# Patient Record
Sex: Male | Born: 1965 | Race: Black or African American | Hispanic: No | Marital: Married | Smoking: Former smoker
Health system: Southern US, Community
[De-identification: ages and names within clinical notes are randomized; demographics above are authoritative.]

## PROBLEM LIST (undated history)

## (undated) DIAGNOSIS — M51369 Other intervertebral disc degeneration, lumbar region without mention of lumbar back pain or lower extremity pain: Secondary | ICD-10-CM

## (undated) DIAGNOSIS — M5136 Other intervertebral disc degeneration, lumbar region: Secondary | ICD-10-CM

## (undated) HISTORY — PX: NO PAST SURGERIES: SHX2092

---

## 2002-04-24 ENCOUNTER — Inpatient Hospital Stay (HOSPITAL_COMMUNITY): Admission: AC | Admit: 2002-04-24 | Discharge: 2002-04-25 | Payer: Self-pay

## 2002-04-24 ENCOUNTER — Encounter: Payer: Self-pay | Admitting: Orthopedic Surgery

## 2004-12-08 ENCOUNTER — Emergency Department: Payer: Self-pay | Admitting: Emergency Medicine

## 2004-12-12 ENCOUNTER — Emergency Department: Payer: Self-pay | Admitting: Emergency Medicine

## 2005-01-17 ENCOUNTER — Emergency Department: Payer: Self-pay | Admitting: Emergency Medicine

## 2005-12-26 ENCOUNTER — Emergency Department: Payer: Self-pay | Admitting: Emergency Medicine

## 2005-12-26 ENCOUNTER — Other Ambulatory Visit: Payer: Self-pay

## 2007-04-19 IMAGING — CR DG CHEST 2V
1 series · 2 of 2 positions shown · non-contrast
Comparison: none

REASON FOR EXAM: Chest pain
COMMENTS:

[Series 4418: postero_anterior · 0.22mm/px · 2 of 2 slices shown]
[im 1/2]
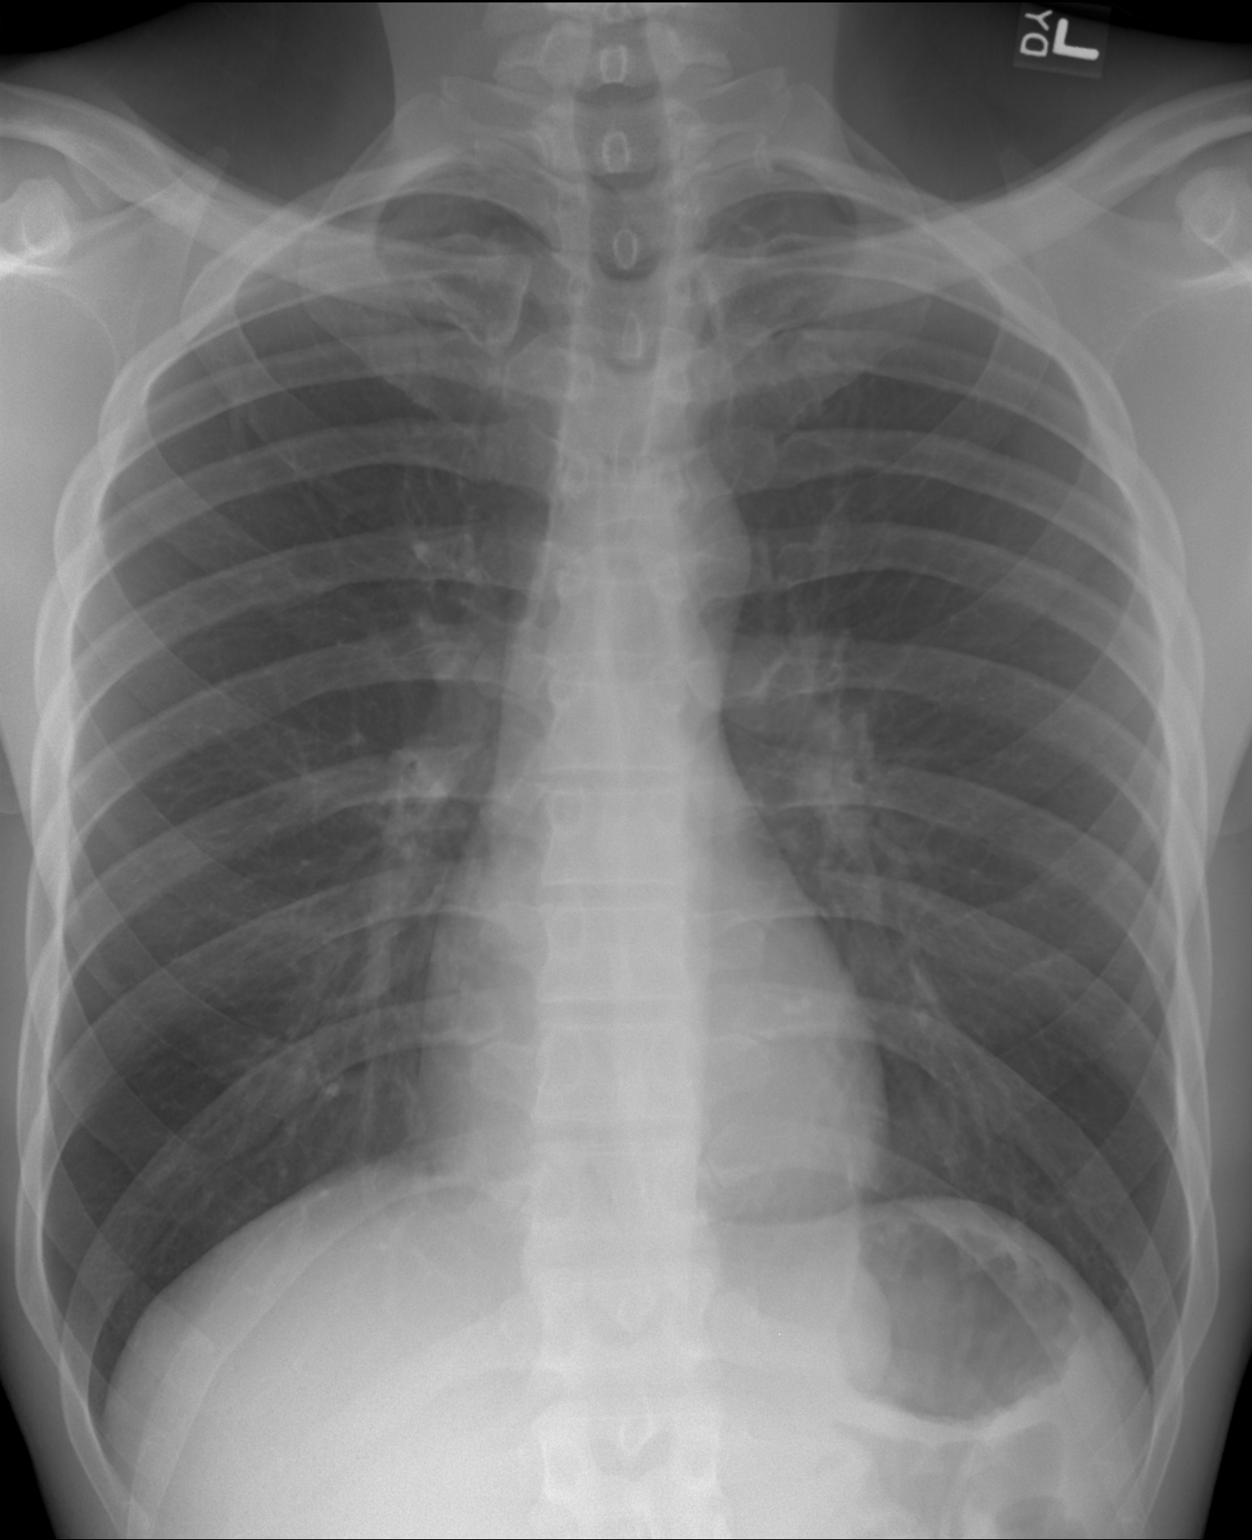
[im 2/2]
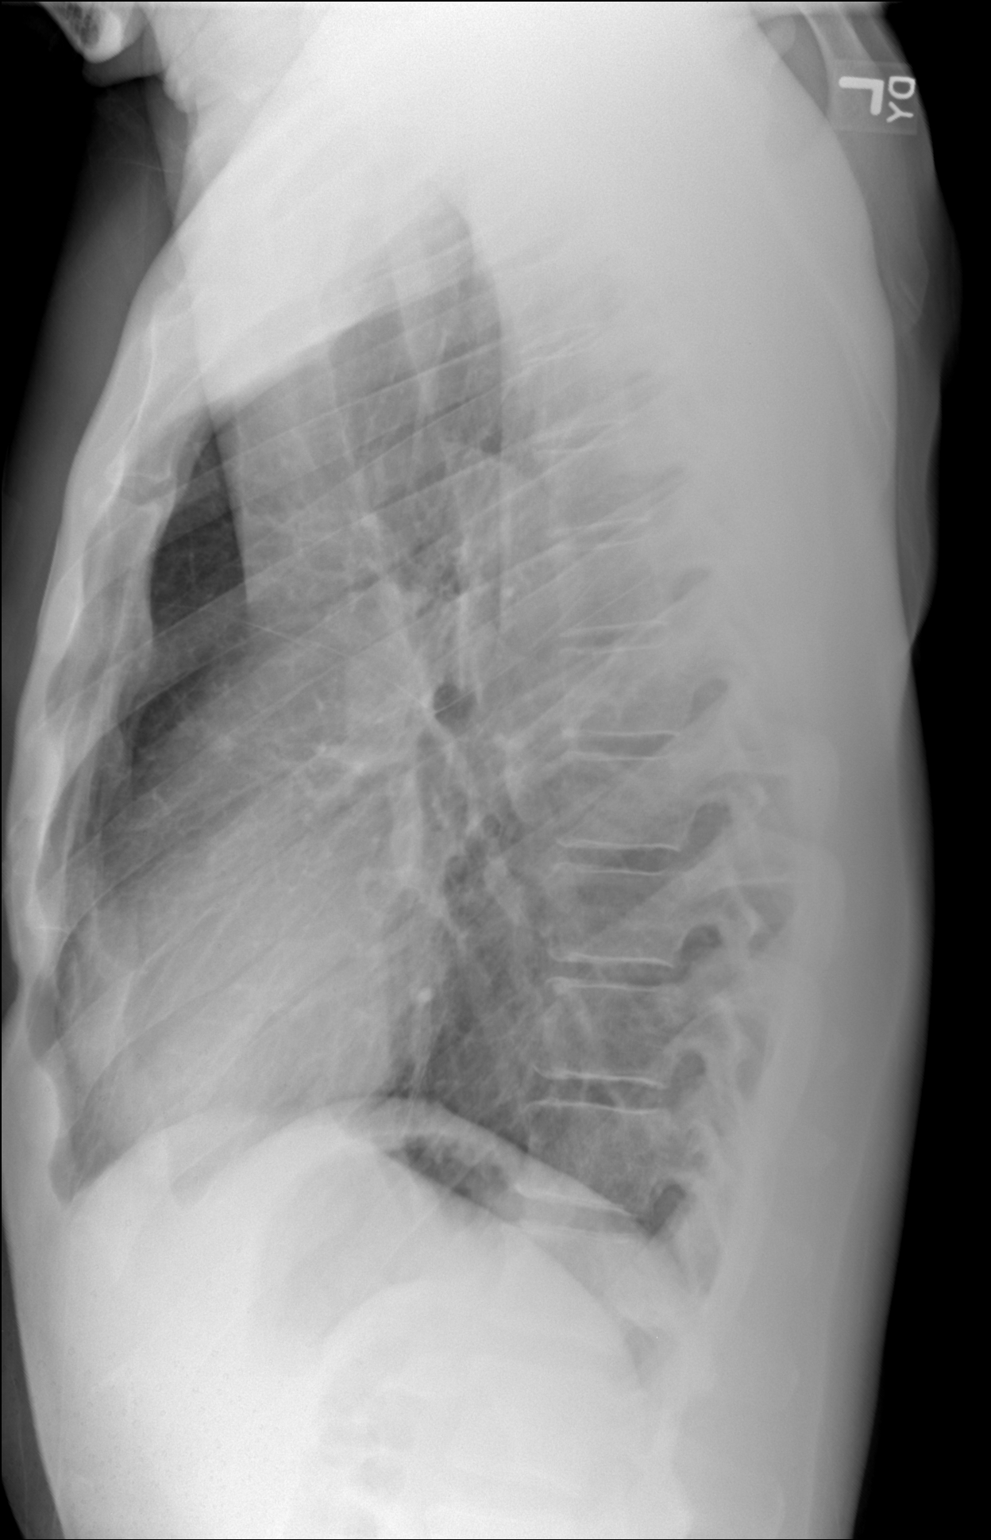

[2 of 2 positions shown; findings below may reference images not displayed]

PROCEDURE:     DXR - DXR CHEST PA (OR AP) AND LATERAL  - December 26, 2005 [DATE]

RESULT:     The lung fields are clear. No pneumonia, pneumothorax or pleural
effusion is seen. The heart size is normal. The mediastinal and osseous
structures show no significant abnormalities. The chest appears mildly
hyperexpanded suspicious for early manifestation of COPD or history of
asthma.
IMPRESSION: 1.     The lung fields are clear.
2.     The chest is mildly hyperexpanded.

## 2008-07-26 ENCOUNTER — Emergency Department: Payer: Self-pay | Admitting: Emergency Medicine

## 2009-02-09 ENCOUNTER — Ambulatory Visit: Payer: Self-pay | Admitting: Unknown Physician Specialty

## 2010-11-19 ENCOUNTER — Emergency Department: Payer: Self-pay | Admitting: Emergency Medicine
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
## 2011-05-19 ENCOUNTER — Emergency Department: Payer: Self-pay | Admitting: *Deleted

## 2012-03-12 IMAGING — CT CT NECK WITH CONTRAST
1 series · 12 of 14 positions shown, 15 images · IV contrast (isovue)
Comparison: none

REASON FOR EXAM: neck pain wtih numbness into the right arm atraumatic
with sore throat.
COMMENTS:

PROCEDURE:     CT  - CT NECK WITH CONTRAST  - November 19, 2010  [DATE]
RESULT:     Comparison: None.
TECHNIQUE: Standard neck CT protocol, after the administration of 75 mL
Isovue 370.

[Series 4: lung windows · axial · 0.66mm/px · z∈[+188,+263]mm · 12 of 31 slices shown, 15 images]
[im 3/31  soft-tissue]
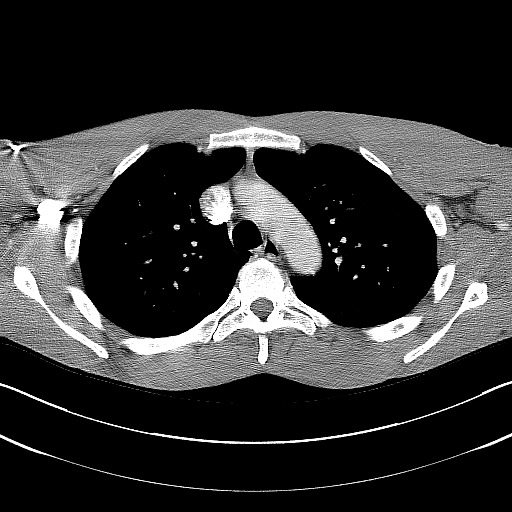
[im 3/31  bone]
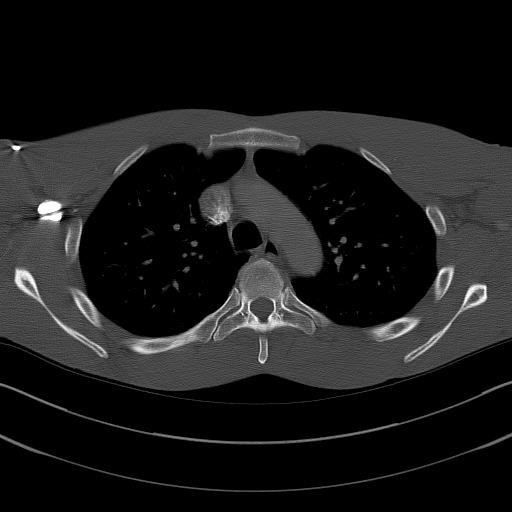
[im 5/31  bone]
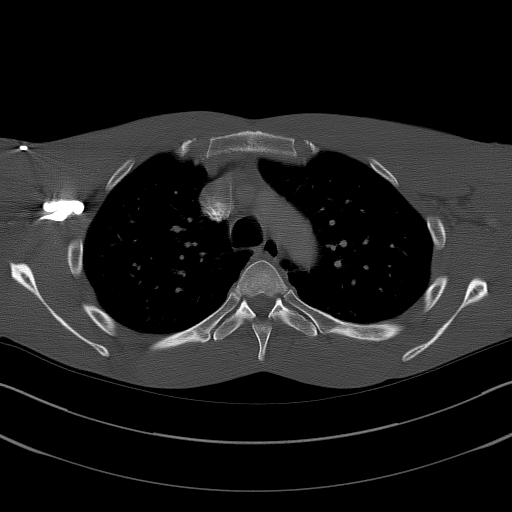
[im 7/31  bone]
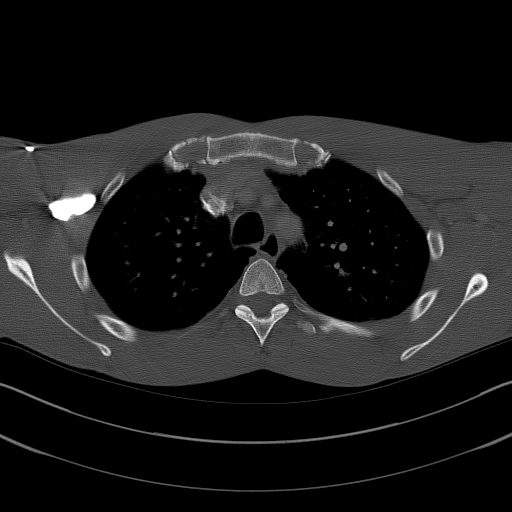
[im 10/31  bone]
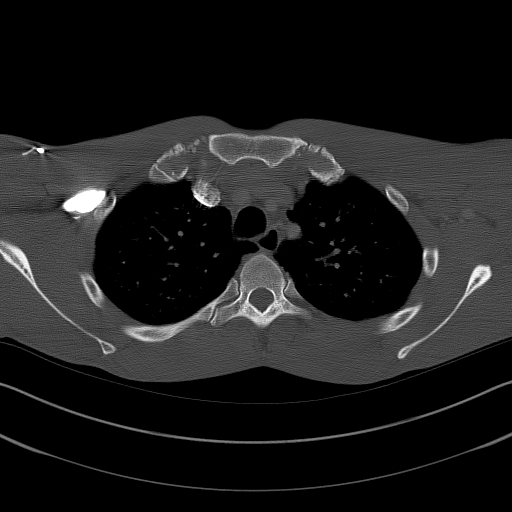
[im 12/31  soft-tissue]
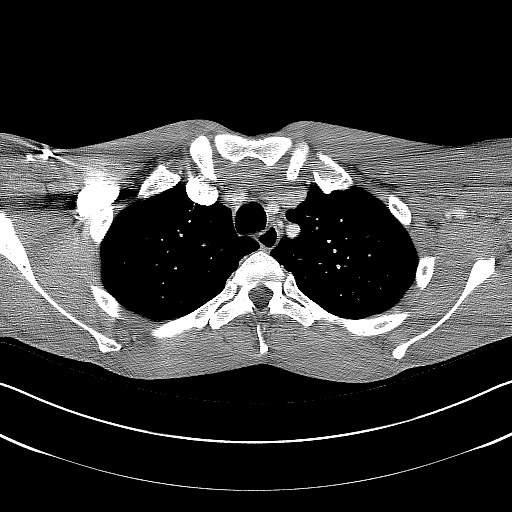
[im 12/31  bone]
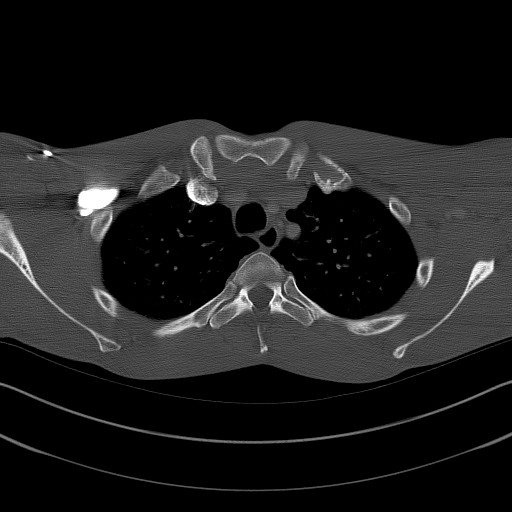
[im 14/31  bone]
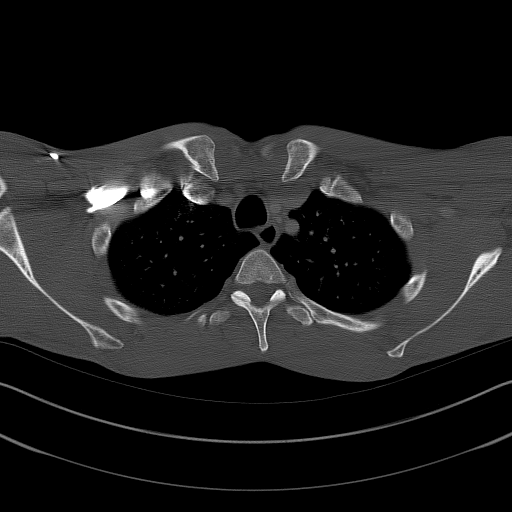
[im 17/31  bone]
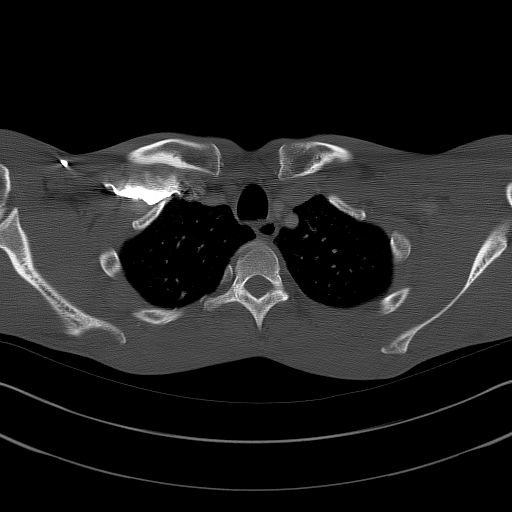
[im 19/31  bone]
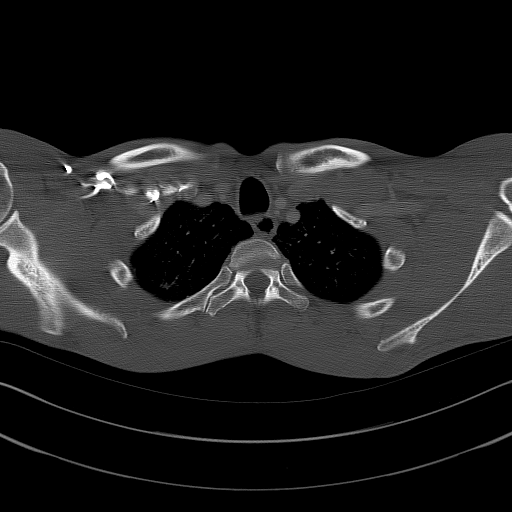
[im 21/31  soft-tissue]
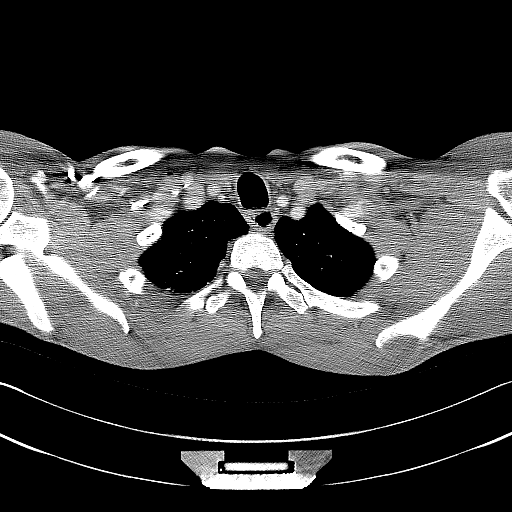
[im 21/31  bone]
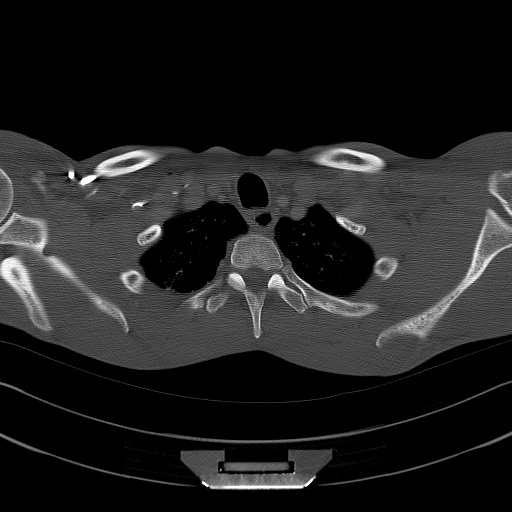
[im 24/31  bone]
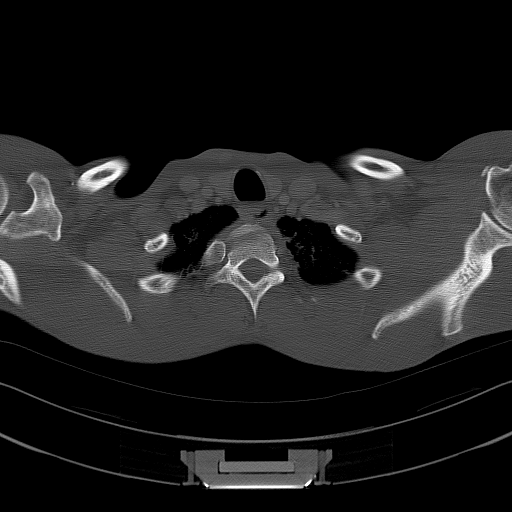
[im 26/31  bone]
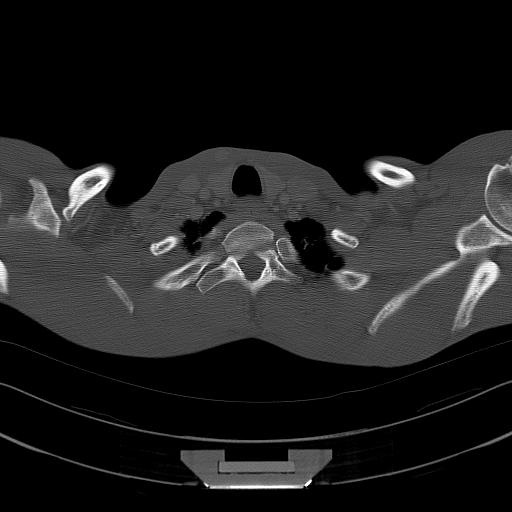
[im 28/31  bone]
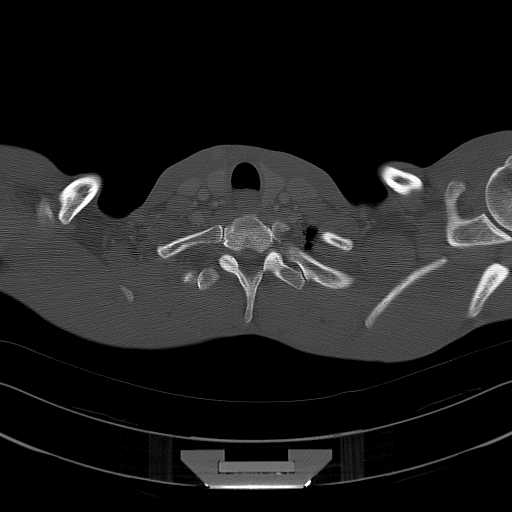

[12 of 14 positions shown; findings below may reference images not displayed]

FINDINGS: No lymphadenopathy identified in the neck. No fluid collection seen. The
prevertebral soft tissues are within normal limits. The airway is patent.
The palatine tonsils are slightly prominent, but otherwise unremarkable.

There is moderate opacification of the maxillary sinuses with polypoid
mucosal thickening.

No aggressive lytic or sclerotic osseous lesions identified. There is mild
paraseptal emphysema at the lung apices. There is mild scarring of the lung
apices.
IMPRESSION: 1. No acute findings in the neck.
2. Maxillary sinus disease.

## 2012-09-09 IMAGING — CR DG SHOULDER 3+V*L*
1 series · 3 of 3 positions shown · non-contrast
Comparison: none

REASON FOR EXAM: pain
COMMENTS:

[Series 1: view not recorded · 0.17mm/px · 3 of 3 slices shown]
[im 1/3]
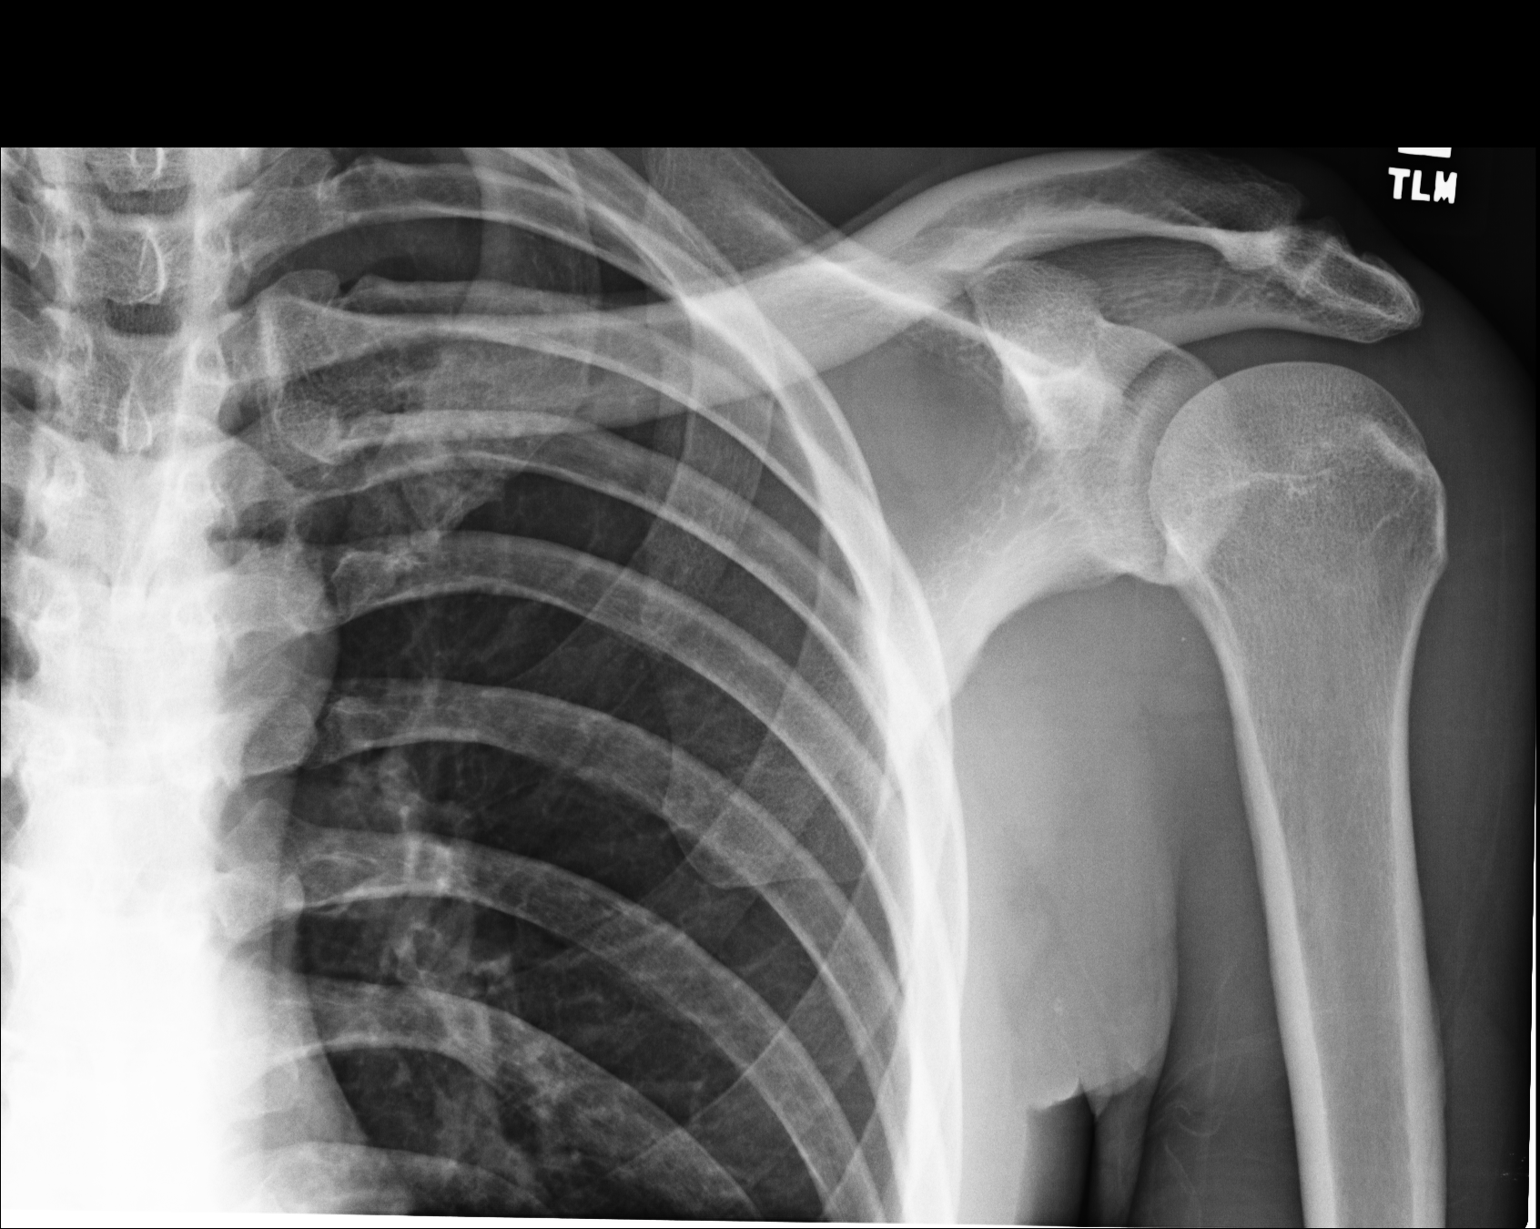
[im 2/3]
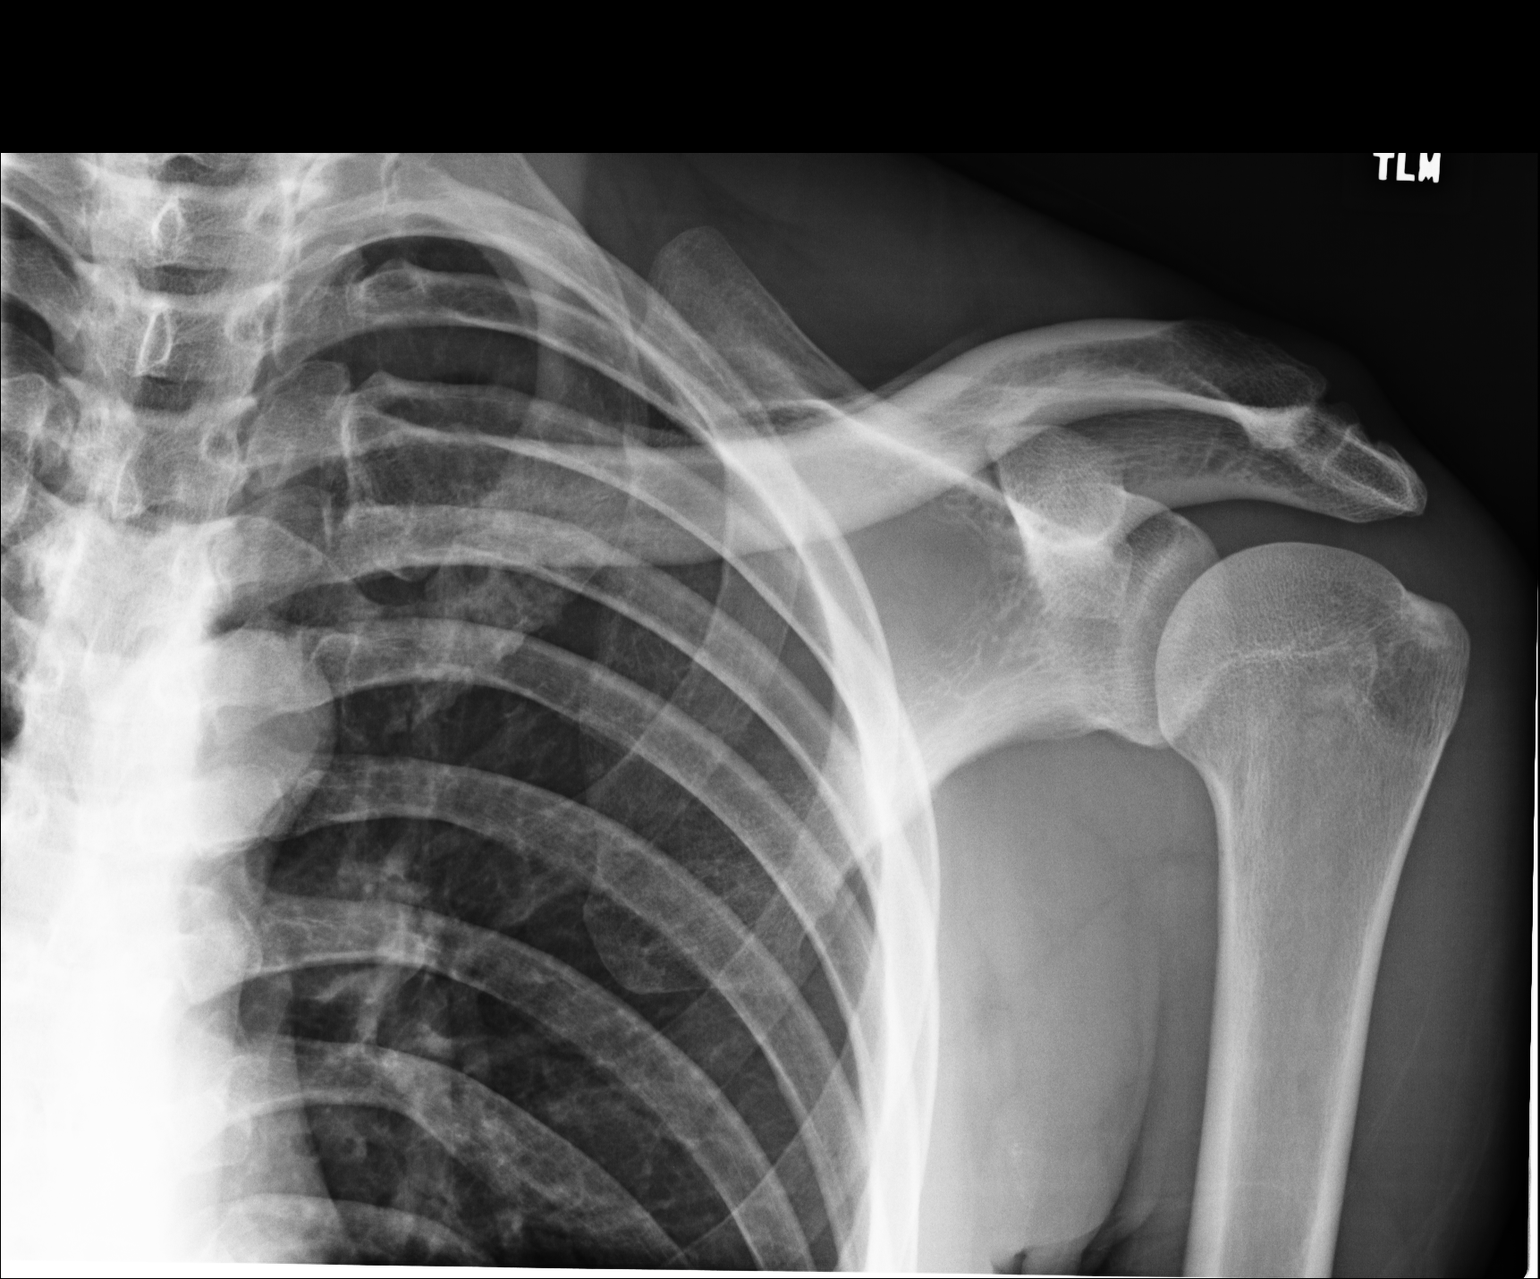
[im 3/3]
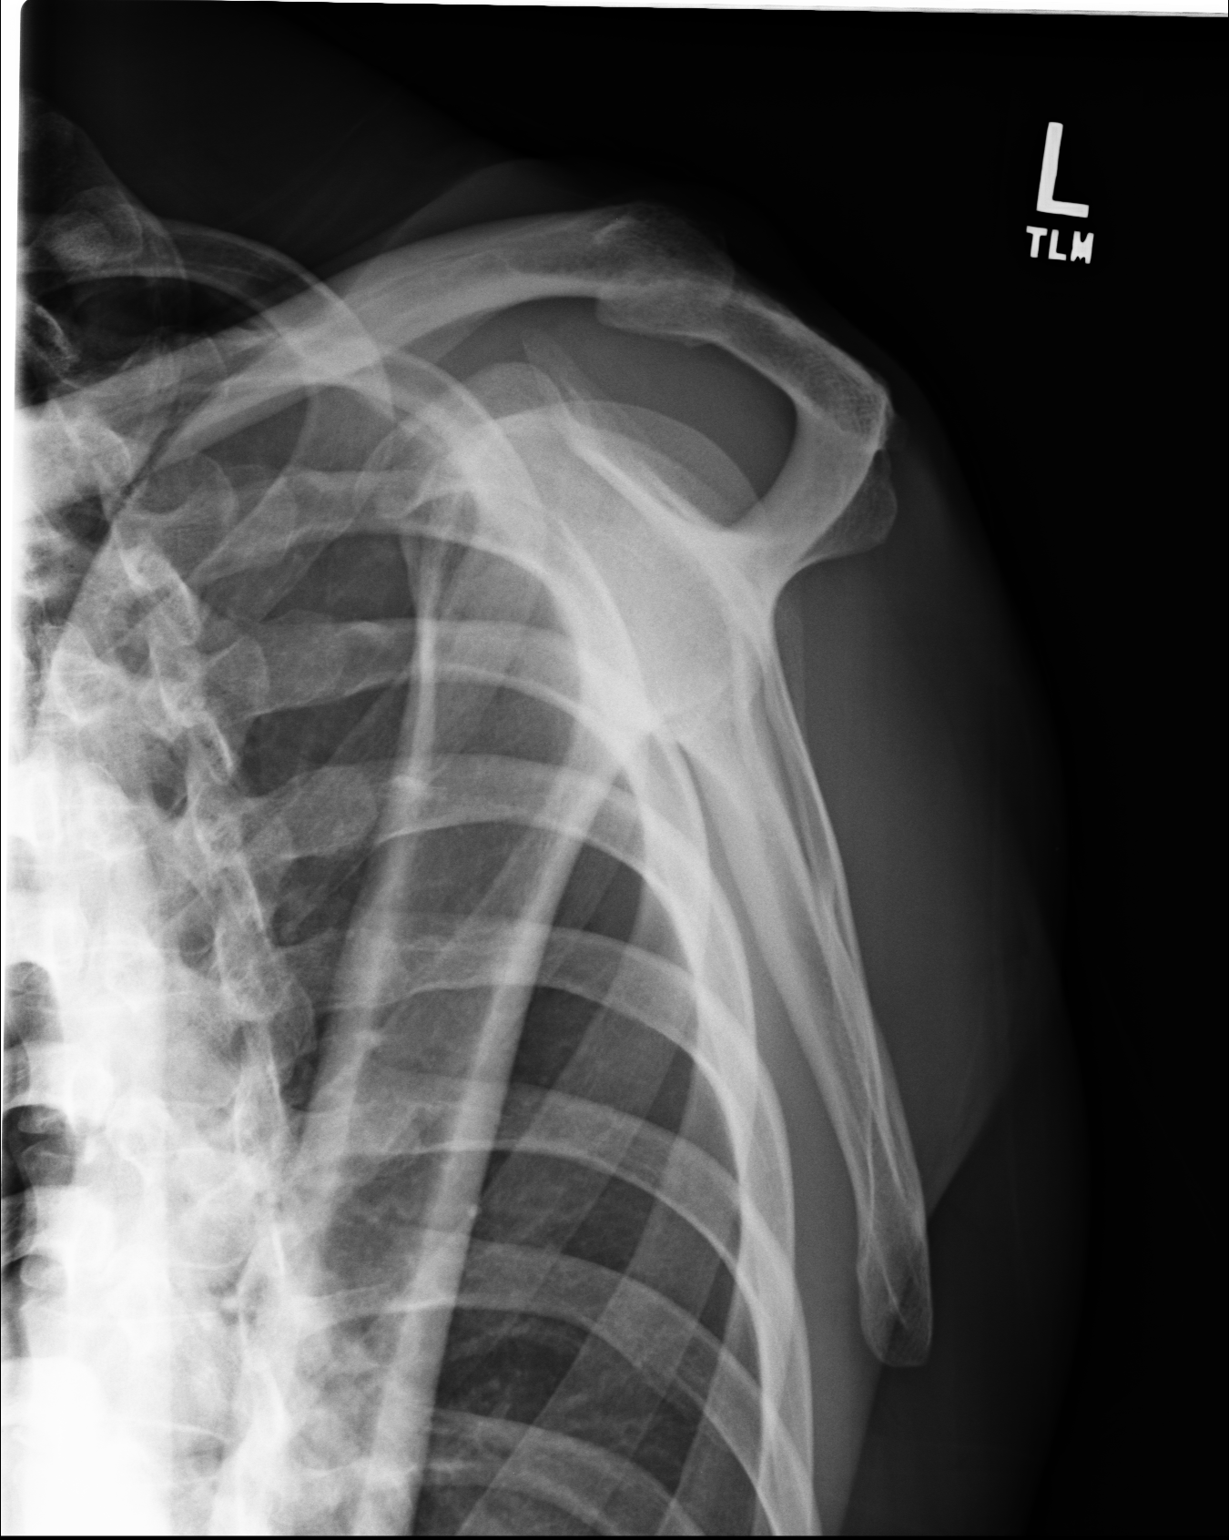

[3 of 3 positions shown; findings below may reference images not displayed]

PROCEDURE:     DXR - DXR SHOULDER LEFT COMPLETE  - May 19, 2011  [DATE]

RESULT:     Three views of the left shoulder are submitted. The bones appear
adequately mineralized. I do not see evidence of an acute fracture. No
significant degenerative changes are demonstrated. There is no evidence of
dislocation.
IMPRESSION: I see no acute bony abnormality of the left shoulder.

## 2018-01-20 ENCOUNTER — Emergency Department (HOSPITAL_COMMUNITY)
Admission: EM | Admit: 2018-01-20 | Discharge: 2018-01-21 | Disposition: A | Discharge status: Home / Self Care | Payer: Self-pay | Attending: Emergency Medicine | Admitting: Emergency Medicine

## 2018-01-20 DIAGNOSIS — M542 Cervicalgia: Secondary | ICD-10-CM | POA: Insufficient documentation

## 2018-01-20 DIAGNOSIS — J029 Acute pharyngitis, unspecified: Secondary | ICD-10-CM | POA: Insufficient documentation

## 2018-01-20 DIAGNOSIS — Z5321 Procedure and treatment not carried out due to patient leaving prior to being seen by health care provider: Secondary | ICD-10-CM | POA: Insufficient documentation

## 2018-01-21 ENCOUNTER — Other Ambulatory Visit: Payer: Self-pay

## 2018-01-21 ENCOUNTER — Encounter (HOSPITAL_COMMUNITY): Payer: Self-pay | Admitting: Emergency Medicine

## 2018-01-21 LAB — GROUP A STREP BY PCR: Group A Strep by PCR: NOT DETECTED

## 2018-01-21 NOTE — ED Triage Notes (Signed)
Pt reports sore throat and neck pain for the last 3 days. Pt reports pain when swallowing.

## 2018-01-21 NOTE — ED Notes (Signed)
Pt stated to this writer that he could not wait any longer and said he was going to leave

## 2018-06-25 ENCOUNTER — Other Ambulatory Visit: Payer: Self-pay

## 2018-06-25 ENCOUNTER — Ambulatory Visit
Admission: EM | Admit: 2018-06-25 | Discharge: 2018-06-25 | Disposition: A | Discharge status: Home / Self Care | Payer: 59 | Attending: Family Medicine | Admitting: Family Medicine

## 2018-06-25 ENCOUNTER — Encounter: Payer: Self-pay | Admitting: Emergency Medicine

## 2018-06-25 DIAGNOSIS — R69 Illness, unspecified: Secondary | ICD-10-CM | POA: Diagnosis not present

## 2018-06-25 DIAGNOSIS — M791 Myalgia, unspecified site: Secondary | ICD-10-CM

## 2018-06-25 DIAGNOSIS — R05 Cough: Secondary | ICD-10-CM | POA: Diagnosis not present

## 2018-06-25 DIAGNOSIS — R0981 Nasal congestion: Secondary | ICD-10-CM | POA: Diagnosis not present

## 2018-06-25 DIAGNOSIS — J111 Influenza due to unidentified influenza virus with other respiratory manifestations: Secondary | ICD-10-CM

## 2018-06-25 MED ORDER — IBUPROFEN 800 MG PO TABS: 800.0000 mg | ORAL_TABLET | Freq: Three times a day (TID) | ORAL | 0 refills | Status: DC | PRN

## 2018-06-25 MED ORDER — BENZONATATE 100 MG PO CAPS: 100.0000 mg | ORAL_CAPSULE | Freq: Three times a day (TID) | ORAL | 0 refills | Status: DC | PRN

## 2018-06-25 NOTE — Discharge Instructions (Signed)
Rest, fluids.  Use the medications as needed.  Take care  Dr. Adriana Simasook

## 2018-06-25 NOTE — ED Provider Notes (Signed)
[facility]    CSN: [id number] Arrival date & time: 06/25/18  1543  History   Chief Complaint Chief Complaint  Patient presents with  . Cough   HPI  52 year old male presents with cough, congestion, runny nose, body aches.  Patient reports that his symptoms started on Saturday.  He reports body aches, cough, congestion, and runny nose.  Symptoms are severe.  Most concerning symptom is body aches.  No documented fever.  No chills.  No medications or interventions tried.  No known exacerbating or relieving factors.  No other complaints.  Social history reviewed as below. Social History Social History   Tobacco Use  . Smoking status: Never Smoker  . Smokeless tobacco: Never Used  Substance Use Topics  . Alcohol use: Never    Frequency: Never  . Drug use: Never     Allergies   Patient has no known allergies.   Review of Systems Review of Systems  Constitutional: Negative for fever.  HENT: Positive for congestion and rhinorrhea.   Respiratory: Positive for cough.   Musculoskeletal:       Bodyaches.   Physical Exam Triage Vital Signs ED Triage Vitals  Enc Vitals Group     BP 06/25/18 1546 115/83     Pulse Rate 06/25/18 1546 62     Resp 06/25/18 1546 16     Temp 06/25/18 1546 98.2 F (36.8 C)     Temp Source 06/25/18 1546 Oral     SpO2 06/25/18 1546 99 %     Weight 06/25/18 1545 187 lb (84.8 kg)     Height 06/25/18 1545 6' 1.5" (1.867 m)     Head Circumference --      Peak Flow --      Pain Score 06/25/18 1545 7     Pain Loc --      Pain Edu? --      Excl. in GC? --    Updated Vital Signs BP 115/83 (BP Location: Left Arm)   Pulse 62   Temp 98.2 F (36.8 C) (Oral)   Resp 16   Ht 6' 1.5" (1.867 m)   Wt 84.8 kg   SpO2 99%   BMI 24.34 kg/m   Visual Acuity Right Eye Distance:   Left Eye Distance:   Bilateral Distance:    Right Eye Near:   Left Eye Near:    Bilateral Near:     Physical Exam  Constitutional: He is oriented to person,  place, and time. He appears well-developed. No distress.  HENT:  Head: Normocephalic and atraumatic.  Mouth/Throat: Oropharynx is clear and moist.  Cardiovascular: Normal rate and regular rhythm.  Pulmonary/Chest: Effort normal and breath sounds normal.  Neurological: He is alert and oriented to person, place, and time.  Psychiatric: He has a normal mood and affect. His behavior is normal.  Nursing note and vitals reviewed.  UC Treatments / Results  Labs (all labs ordered are listed, but only abnormal results are displayed) Labs Reviewed - No data to display  EKG None  Radiology No results found.  Procedures Procedures (including critical care time)  Medications Ordered in UC Medications - No data to display  Initial Impression / Assessment and Plan / UC Course  I have reviewed the triage vital signs and the nursing notes.  Pertinent labs & imaging results that were available during my care of the patient were reviewed by me and considered in my medical decision making (see chart for details).    52 year old  male presents with influenza-like illness.  Out of the treatment window for flu.  Ibuprofen and Tessalon Perles.  Supportive care.   Final Clinical Impressions(s) / UC Diagnoses   Final diagnoses:  Influenza-like illness     Discharge Instructions     Rest, fluids.  Use the medications as needed.  Take care  Dr. Adriana Simasook    ED Prescriptions    Medication Sig Dispense Auth. Provider   ibuprofen (ADVIL,MOTRIN) 800 MG tablet Take 1 tablet (800 mg total) by mouth every 8 (eight) hours as needed for fever, mild pain or moderate pain. 30 tablet Krislynn Gronau G, DO   benzonatate (TESSALON) 100 MG capsule Take 1 capsule (100 mg total) by mouth 3 (three) times daily as needed. 30 capsule Tommie Samsook, Camber Ninh G, DO     Controlled Substance Prescriptions [place] Controlled Substance Registry consulted? Not Applicable   Tommie SamsCook, Anapaula Severt G, DO 06/25/18 [id number]

## 2018-06-25 NOTE — ED Triage Notes (Signed)
Patient c/o bodyaches, cough, congestion and runny nose that started Saturday.

## 2018-08-12 ENCOUNTER — Other Ambulatory Visit: Payer: Self-pay

## 2018-08-12 ENCOUNTER — Ambulatory Visit
Admission: EM | Admit: 2018-08-12 | Discharge: 2018-08-12 | Disposition: A | Discharge status: Home / Self Care | Payer: 59 | Attending: Family Medicine | Admitting: Family Medicine

## 2018-08-12 ENCOUNTER — Encounter: Payer: Self-pay | Admitting: Emergency Medicine

## 2018-08-12 DIAGNOSIS — R300 Dysuria: Secondary | ICD-10-CM | POA: Diagnosis not present

## 2018-08-12 LAB — URINALYSIS, COMPLETE (UACMP) WITH MICROSCOPIC
BACTERIA UA: NONE SEEN
Bilirubin Urine: NEGATIVE
GLUCOSE, UA: NEGATIVE mg/dL
Hgb urine dipstick: NEGATIVE
Ketones, ur: NEGATIVE mg/dL
LEUKOCYTES UA: NEGATIVE
Nitrite: NEGATIVE
PROTEIN: NEGATIVE mg/dL
RBC / HPF: NONE SEEN RBC/hpf (ref 0–5)
SQUAMOUS EPITHELIAL / LPF: NONE SEEN (ref 0–5)
Specific Gravity, Urine: 1.025 (ref 1.005–1.030)
pH: 5.5 (ref 5.0–8.0)

## 2018-08-12 MED ORDER — AZITHROMYCIN 500 MG PO TABS
1000.0000 mg | ORAL_TABLET | Freq: Once | ORAL | Status: AC
  Administered 2018-08-12: 1000 mg via ORAL

## 2018-08-12 NOTE — ED Triage Notes (Signed)
Patient c/o dysuria and left side pain that started 3 days ago.

## 2018-08-12 NOTE — ED Provider Notes (Signed)
[facility]    CSN: [id number] Arrival date & time: 08/12/18  4098     History   Chief Complaint Chief Complaint  Patient presents with  . Dysuria  . Abdominal Pain    HPI Chad Jenkins is a 52 y.o. male.   52 yo male with a c/o burning with urination and left sided mild, intermittent low back pain for the past 3 days. Denies any fevers, chills, nausea, vomiting, hematuria, diarrhea, constipation, injury. States he has noticed some mild clear penile discharge. States he has had different sexual partners and "condom has broken". States would like to get checked for STD (gc/chlamydia) but declines HIV and RPR testing.   The history is provided by the patient.  Dysuria  Associated symptoms include abdominal pain.  Abdominal Pain  Associated symptoms: dysuria     History reviewed. No pertinent past medical history.  There are no active problems to display for this patient.   History reviewed. No pertinent surgical history.     Home Medications    Prior to Admission medications   Medication Sig Start Date End Date Taking? Authorizing Provider  benzonatate (TESSALON) 100 MG capsule Take 1 capsule (100 mg total) by mouth 3 (three) times daily as needed. 06/25/18   Tommie Sams, DO  ibuprofen (ADVIL,MOTRIN) 800 MG tablet Take 1 tablet (800 mg total) by mouth every 8 (eight) hours as needed for fever, mild pain or moderate pain. 06/25/18   Tommie Sams, DO    Family History History reviewed. No pertinent family history.  Social History Social History   Tobacco Use  . Smoking status: Current Every Day Smoker  . Smokeless tobacco: Never Used  Substance Use Topics  . Alcohol use: Never    Frequency: Never  . Drug use: Never     Allergies   Patient has no known allergies.   Review of Systems Review of Systems  Gastrointestinal: Positive for abdominal pain.  Genitourinary: Positive for dysuria.     Physical Exam Triage Vital Signs ED Triage  Vitals  Enc Vitals Group     BP 08/12/18 0821 129/80     Pulse Rate 08/12/18 0821 65     Resp 08/12/18 0821 18     Temp 08/12/18 0821 98.1 F (36.7 C)     Temp Source 08/12/18 0821 Oral     SpO2 08/12/18 0821 100 %     Weight 08/12/18 0822 180 lb (81.6 kg)     Height 08/12/18 0822 6\' 1"  (1.854 m)     Head Circumference --      Peak Flow --      Pain Score 08/12/18 0822 6     Pain Loc --      Pain Edu? --      Excl. in GC? --    No data found.  Updated Vital Signs BP 129/80 (BP Location: Left Arm)   Pulse 65   Temp 98.1 F (36.7 C) (Oral)   Resp 18   Ht 6\' 1"  (1.854 m)   Wt 81.6 kg   SpO2 100%   BMI 23.75 kg/m   Visual Acuity Right Eye Distance:   Left Eye Distance:   Bilateral Distance:    Right Eye Near:   Left Eye Near:    Bilateral Near:     Physical Exam  Constitutional: He is oriented to person, place, and time. He appears well-developed and well-nourished. No distress.  Cardiovascular: Normal rate.  Pulmonary/Chest: Effort normal. No  respiratory distress.  Abdominal: Soft. Bowel sounds are normal. He exhibits no distension and no mass. There is no tenderness. There is no rebound and no guarding.  Genitourinary: Testes normal and penis normal.  Lymphadenopathy: Inguinal adenopathy noted on the right (mild) and left (mild) side.  Neurological: He is alert and oriented to person, place, and time.  Skin: No rash noted. He is not diaphoretic.  Nursing note and vitals reviewed.    UC Treatments / Results  Labs (all labs ordered are listed, but only abnormal results are displayed) Labs Reviewed  CHLAMYDIA/NGC RT PCR ([facility] ONLY)  URINALYSIS, COMPLETE ([facility]) WITH MICROSCOPIC  MISC [facility] TEST (SEND OUT)    EKG None  Radiology No results found.  Procedures Procedures (including critical care time)  Medications Ordered in UC Medications  azithromycin (ZITHROMAX) tablet 1,000 mg (1,000 mg Oral Given 08/12/18 0900)    Initial Impression  / Assessment and Plan / UC Course  I have reviewed the triage vital signs and the nursing notes.  Pertinent labs & imaging results that were available during my care of the patient were reviewed by me and considered in my medical decision making (see chart for details).      Final Clinical Impressions(s) / UC Diagnoses   Final diagnoses:  Dysuria   Discharge Instructions   None    ED Prescriptions    None     1. diagnosis reviewed with patient 2. Patient given zithromax 1gm po x 1 (empiric tx) 3. Recommend checking labs as per orders 4. UA results negative and reviewed with patient  5. Follow-up prn if symptoms worsen or don't improve   Controlled Substance Prescriptions [place] Controlled Substance Registry consulted? Not Applicable   Payton Mccallum, MD 08/12/18 1015

## 2018-08-14 LAB — MISC LABCORP TEST (SEND OUT): LABCORP TEST CODE: 183194

## 2018-08-16 ENCOUNTER — Encounter: Payer: Self-pay | Admitting: Emergency Medicine

## 2018-08-16 ENCOUNTER — Other Ambulatory Visit: Payer: Self-pay

## 2018-08-16 ENCOUNTER — Ambulatory Visit
Admission: EM | Admit: 2018-08-16 | Discharge: 2018-08-16 | Disposition: A | Discharge status: Home / Self Care | Payer: 59 | Attending: Family Medicine | Admitting: Family Medicine

## 2018-08-16 DIAGNOSIS — Z87438 Personal history of other diseases of male genital organs: Secondary | ICD-10-CM

## 2018-08-16 LAB — CHLAMYDIA/NGC RT PCR (ARMC ONLY)
Chlamydia Tr: NOT DETECTED
N gonorrhoeae: NOT DETECTED

## 2018-08-16 MED ORDER — DOXYCYCLINE HYCLATE 100 MG PO TABS: 100.0000 mg | ORAL_TABLET | Freq: Two times a day (BID) | ORAL | 0 refills | Status: DC

## 2018-08-16 NOTE — ED Provider Notes (Addendum)
[facility]    CSN: [id number] Arrival date & time: 08/16/18  0911     History   Chief Complaint Chief Complaint  Patient presents with  . Penile Discharge    HPI Chad Jenkins is a 52 y.o. male.   52 yo male with a c/o penile discharge. Patient was seen 4 days ago with the same, treated empirically with zithromax and penile swab test negative to GC and chlamydia. Patient states symptoms are better, improved, however still having some penile drainage. Denies any fevers, chills.   The history is provided by the patient.  Penile Discharge     History reviewed. No pertinent past medical history.  There are no active problems to display for this patient.   Past Surgical History:  Procedure Laterality Date  . NO PAST SURGERIES         Home Medications    Prior to Admission medications   Medication Sig Start Date End Date Taking? Authorizing Provider  benzonatate (TESSALON) 100 MG capsule Take 1 capsule (100 mg total) by mouth 3 (three) times daily as needed. 06/25/18   Tommie Sams, DO  doxycycline (VIBRA-TABS) 100 MG tablet Take 1 tablet (100 mg total) by mouth 2 (two) times daily. 08/16/18   Payton Mccallum, MD  ibuprofen (ADVIL,MOTRIN) 800 MG tablet Take 1 tablet (800 mg total) by mouth every 8 (eight) hours as needed for fever, mild pain or moderate pain. 06/25/18   Tommie Sams, DO    Family History Family History  Problem Relation Age of Onset  . Healthy Mother   . Cancer Father        brain    Social History Social History   Tobacco Use  . Smoking status: Current Every Day Smoker    Types: Cigars  . Smokeless tobacco: Never Used  Substance Use Topics  . Alcohol use: Never    Frequency: Never  . Drug use: Never     Allergies   Patient has no known allergies.   Review of Systems Review of Systems  Genitourinary: Positive for discharge.     Physical Exam Triage Vital Signs ED Triage Vitals  Enc Vitals Group     BP 08/16/18  0934 (!) 127/110     Pulse Rate 08/16/18 0934 62     Resp 08/16/18 0934 17     Temp 08/16/18 0934 98.2 F (36.8 C)     Temp Source 08/16/18 0934 Oral     SpO2 08/16/18 0934 100 %     Weight 08/16/18 0931 186 lb (84.4 kg)     Height 08/16/18 0931 6\' 1"  (1.854 m)     Head Circumference --      Peak Flow --      Pain Score 08/16/18 0931 6     Pain Loc --      Pain Edu? --      Excl. in GC? --    No data found.  Updated Vital Signs BP (!) 127/110 (BP Location: Left Arm)   Pulse 62   Temp 98.2 F (36.8 C) (Oral)   Resp 17   Ht 6\' 1"  (1.854 m)   Wt 84.4 kg   SpO2 100%   BMI 24.54 kg/m   Visual Acuity Right Eye Distance:   Left Eye Distance:   Bilateral Distance:    Right Eye Near:   Left Eye Near:    Bilateral Near:     Physical Exam  Constitutional: He appears well-developed and  well-nourished. No distress.  Genitourinary: Testes normal and penis normal. No penile erythema or penile tenderness. No discharge found.  Genitourinary Comments: No penile discharge noted on exam; no skin lesions  Skin: He is not diaphoretic.  Nursing note and vitals reviewed.    UC Treatments / Results  Labs (all labs ordered are listed, but only abnormal results are displayed) Labs Reviewed  CHLAMYDIA/NGC RT PCR [facility] ONLY)    EKG None  Radiology No results found.  Procedures Procedures (including critical care time)  Medications Ordered in UC Medications - No data to display  Initial Impression / Assessment and Plan / UC Course  I have reviewed the triage vital signs and the nursing notes.  Pertinent labs & imaging results that were available during my care of the patient were reviewed by me and considered in my medical decision making (see chart for details).      Final Clinical Impressions(s) / UC Diagnoses   Final diagnoses:  History of penile discharge     Discharge Instructions     Follow up as needed    ED Prescriptions    Medication Sig  Dispense Auth. Provider   doxycycline (VIBRA-TABS) 100 MG tablet  (Status: Discontinued) Take 1 tablet (100 mg total) by mouth 2 (two) times daily. 20 tablet Payton Mccallum, MD   doxycycline (VIBRA-TABS) 100 MG tablet Take 1 tablet (100 mg total) by mouth 2 (two) times daily. 20 tablet Payton Mccallum, MD      1. Diagnosis and possible etiologies reviewed with patient 2. Will treat empirically with doxycycline; rx as per orders above; reviewed possible side effects, interactions, risks and benefits  3. Recheck test (GC/chlamydia)  4. Follow-up prn if symptoms worsen or don't improve   Controlled Substance Prescriptions [place] Controlled Substance Registry consulted? Not Applicable   Payton Mccallum, MD 08/16/18 3086    Payton Mccallum, MD 08/16/18 [phone]

## 2018-08-16 NOTE — ED Triage Notes (Signed)
Pt c/o penile discharge. He was seen on 08/12/18 and treated with 1000 mg of Azithromycin. He was tested for GC/chlamydia but the test was negative. A U/a was also done at that time and was negative.Pt is still having discharge, dysuria and left groin pain. He reports that it initially started about 7 days ago.

## 2018-08-16 NOTE — Discharge Instructions (Signed)
Follow up as needed

## 2019-09-15 ENCOUNTER — Other Ambulatory Visit: Payer: Self-pay | Admitting: Family Medicine

## 2019-09-15 DIAGNOSIS — Z20822 Contact with and (suspected) exposure to covid-19: Secondary | ICD-10-CM

## 2019-09-16 ENCOUNTER — Other Ambulatory Visit: Payer: Self-pay

## 2019-09-16 DIAGNOSIS — Z20822 Contact with and (suspected) exposure to covid-19: Secondary | ICD-10-CM

## 2019-09-18 LAB — NOVEL CORONAVIRUS, NAA: SARS-CoV-2, NAA: NOT DETECTED

## 2019-09-19 ENCOUNTER — Telehealth: Payer: Self-pay | Admitting: General Practice

## 2019-09-19 NOTE — Telephone Encounter (Signed)
   Pt call he saw his neg COVID results on mychart

## 2019-09-22 ENCOUNTER — Telehealth: Payer: Self-pay

## 2019-09-22 NOTE — Telephone Encounter (Signed)
Caller given negative result and verbalized understanding  

## 2019-09-24 ENCOUNTER — Telehealth: Payer: Self-pay

## 2019-09-24 NOTE — Telephone Encounter (Signed)
Pt notified of negative COVID-19 results. Understanding verbalized.  Chasta M Hopkins   

## 2021-04-13 DIAGNOSIS — H04123 Dry eye syndrome of bilateral lacrimal glands: Secondary | ICD-10-CM | POA: Diagnosis not present

## 2021-04-13 DIAGNOSIS — H52223 Regular astigmatism, bilateral: Secondary | ICD-10-CM | POA: Diagnosis not present

## 2021-04-13 DIAGNOSIS — H40012 Open angle with borderline findings, low risk, left eye: Secondary | ICD-10-CM | POA: Diagnosis not present

## 2021-04-13 DIAGNOSIS — H5203 Hypermetropia, bilateral: Secondary | ICD-10-CM | POA: Diagnosis not present

## 2021-04-29 ENCOUNTER — Other Ambulatory Visit: Payer: Self-pay

## 2021-04-29 ENCOUNTER — Encounter: Payer: Self-pay | Admitting: Emergency Medicine

## 2021-04-29 ENCOUNTER — Ambulatory Visit
Admission: EM | Admit: 2021-04-29 | Discharge: 2021-04-29 | Disposition: A | Discharge status: Home / Self Care | Payer: BC Managed Care – PPO | Attending: Sports Medicine | Admitting: Sports Medicine

## 2021-04-29 DIAGNOSIS — M545 Low back pain, unspecified: Secondary | ICD-10-CM | POA: Diagnosis not present

## 2021-04-29 DIAGNOSIS — M62830 Muscle spasm of back: Secondary | ICD-10-CM | POA: Diagnosis not present

## 2021-04-29 LAB — URINALYSIS, COMPLETE (UACMP) WITH MICROSCOPIC
Bilirubin Urine: NEGATIVE
Glucose, UA: NEGATIVE mg/dL
Hgb urine dipstick: NEGATIVE
Ketones, ur: NEGATIVE mg/dL
Leukocytes,Ua: NEGATIVE
Nitrite: NEGATIVE
Protein, ur: 30 mg/dL — AB
Specific Gravity, Urine: 1.02 (ref 1.005–1.030)
pH: 5.5 (ref 5.0–8.0)

## 2021-04-29 MED ORDER — NAPROXEN 500 MG PO TABS: 500.0000 mg | ORAL_TABLET | Freq: Two times a day (BID) | ORAL | 0 refills | Status: AC

## 2021-04-29 MED ORDER — CYCLOBENZAPRINE HCL 10 MG PO TABS: 10.0000 mg | ORAL_TABLET | Freq: Three times a day (TID) | ORAL | 0 refills | Status: AC | PRN

## 2021-04-29 NOTE — ED Triage Notes (Signed)
Pt c/o lower back pain. Started about 2 days ago. He states standing up and bending over makes the pain worse.

## 2021-04-29 NOTE — Discharge Instructions (Addendum)

## 2021-04-29 NOTE — ED Provider Notes (Signed)
[facility]    CSN: [id number] Arrival date & time: 04/29/21  9562      History   Chief Complaint Chief Complaint  Patient presents with   Back Pain    HPI Chad Jenkins is a 55 y.o. male presenting for 2-day history of aching left lower back pain.  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  Pain is mostly noticeable and aching whenever he bends forward, extends back, or rotates at hips.  Pain does not radiate to lower extremities.  No numbness, weakness or tingling.  No bowel or bladder incontinence or saddle anesthesia.  He has not taken any medication for his symptoms.  He states that he has had some urgency of urination but denies any dysuria, hematuria or abdominal pain.  He has no other complaints or concerns.  HPI  History reviewed. No pertinent past medical history.  There are no problems to display for this patient.   Past Surgical History:  Procedure Laterality Date   NO PAST SURGERIES         Home Medications    Prior to Admission medications   Medication Sig Start Date End Date Taking? Authorizing Provider  cyclobenzaprine (FLEXERIL) 10 MG tablet Take 1 tablet (10 mg total) by mouth 3 (three) times daily as needed for up to 7 days for muscle spasms. 04/29/21 05/06/21 Yes Eusebio Friendly B, PA-C  naproxen (NAPROSYN) 500 MG tablet Take 1 tablet (500 mg total) by mouth 2 (two) times daily for 10 days. 04/29/21 05/09/21 Yes Shirlee Latch, PA-C    Family History Family History  Problem Relation Age of Onset   Healthy Mother    Cancer Father        brain    Social History Social History   Tobacco Use   Smoking status: Every Day    Types: Cigars   Smokeless tobacco: Never  Vaping Use   Vaping Use: Never used  Substance Use Topics   Alcohol use: Never   Drug use: Never     Allergies   Patient has no known allergies.   Review of Systems Review of Systems  Constitutional:  Negative for fatigue and fever.   Gastrointestinal:  Negative for abdominal pain.  Genitourinary:  Positive for urgency. Negative for dysuria, flank pain and hematuria.  Musculoskeletal:  Positive for back pain. Negative for gait problem.  Skin:  Negative for rash.  Neurological:  Negative for weakness and numbness.    Physical Exam Triage Vital Signs ED Triage Vitals  Enc Vitals Group     BP 04/29/21 0932 104/66     Pulse Rate 04/29/21 0932 (!) 54     Resp 04/29/21 0932 18     Temp 04/29/21 0932 98.2 F (36.8 C)     Temp Source 04/29/21 0932 Oral     SpO2 04/29/21 0932 97 %     Weight 04/29/21 0931 186 lb 1.1 oz (84.4 kg)     Height 04/29/21 0931 6\' 1"  (1.854 m)     Head Circumference --      Peak Flow --      Pain Score 04/29/21 0929 6     Pain Loc --      Pain Edu? --      Excl. in GC? --    No data found.  Updated Vital Signs BP 104/66 (BP Location: Left Arm)   Pulse (!) 54   Temp 98.2 F (36.8 C) (Oral)   Resp  18   Ht 6\' 1"  (1.854 m)   Wt 186 lb 1.1 oz (84.4 kg)   SpO2 97%   BMI 24.55 kg/m       Physical Exam Vitals and nursing note reviewed.  Constitutional:      General: He is not in acute distress.    Appearance: Normal appearance. He is well-developed. He is not ill-appearing.  HENT:     Head: Normocephalic and atraumatic.  Eyes:     General: No scleral icterus.    Conjunctiva/sclera: Conjunctivae normal.  Cardiovascular:     Rate and Rhythm: Normal rate and regular rhythm.     Heart sounds: Normal heart sounds.  Pulmonary:     Effort: Pulmonary effort is normal. No respiratory distress.     Breath sounds: Normal breath sounds.  Abdominal:     Palpations: Abdomen is soft.     Tenderness: There is no abdominal tenderness. There is left CVA tenderness (mild). There is no right CVA tenderness.  Musculoskeletal:     Cervical back: Neck supple.     Thoracic back: Spasms (stiffness and spasms of thoracolumbar region on the left. Area is TTP) and tenderness present. No bony  tenderness. Normal range of motion (full ROM but movement pain with flexion, extension, rotation).     Lumbar back: Spasms and tenderness present. Normal range of motion. Negative right straight leg raise test and negative left straight leg raise test.       Back:  Skin:    General: Skin is warm and dry.  Neurological:     General: No focal deficit present.     Mental Status: He is alert. Mental status is at baseline.     Motor: No weakness.     Gait: Gait normal.  Psychiatric:        Mood and Affect: Mood normal.        Thought Content: Thought content normal.     UC Treatments / Results  Labs (all labs ordered are listed, but only abnormal results are displayed) Labs Reviewed  URINALYSIS, COMPLETE (UACMP) WITH MICROSCOPIC - Abnormal; Notable for the following components:      Result Value   Protein, ur 30 (*)    Bacteria, UA RARE (*)    All other components within normal limits    EKG   Radiology No results found.  Procedures Procedures (including critical care time)  Medications Ordered in UC Medications - No data to display  Initial Impression / Assessment and Plan / UC Course  I have reviewed the triage vital signs and the nursing notes.  Pertinent labs & imaging results that were available during my care of the patient were reviewed by me and considered in my medical decision making (see chart for details).  55 year old male presenting for left mid back pain for the past 2 days.  Denies injury.  Admits to urinary urgency which he thinks could possibly be associated with his symptoms.  On exam he has stiffness, tenderness and spasms of the left thoracolumbar region.  Full range of motion with pain with flexion, extension and rotation.  Negative straight leg raise.  Urinalysis performed today without any evidence of infection and no hematuria.  Reviewed this with patient.  No concern for pyelonephritis, UTI, or nephrolithiasis.  Symptoms consistent with muscle  strain and spasms.  Treating at this time with cyclobenzaprine and naproxen.  Also encouraged supportive care with use of heating pad and stretches.  ED precautions reviewed.  Work note given.  Final Clinical Impressions(s) / UC Diagnoses   Final diagnoses:  Acute left-sided low back pain without sciatica  Muscle spasm of back     Discharge Instructions      BACK PAIN: Stressed avoiding painful activities . RICE (REST, ICE, COMPRESSION, ELEVATION) guidelines reviewed. May alternate ice and heat. Consider use of muscle rubs, Salonpas patches, etc. Use medications as directed including muscle relaxers if prescribed. Take anti-inflammatory medications as prescribed or OTC NSAIDs/Tylenol.  F/u with PCP in 7-10 days for reexamination, and please feel free to call or return to the urgent care at any time for any questions or concerns you may have and we will be happy to help you!   BACK PAIN RED FLAGS: If the back pain acutely worsens or there are any red flag symptoms such as numbness/tingling, leg weakness, saddle anesthesia, or loss of bowel/bladder control, go immediately to the ER. Follow up with [place] as scheduled or sooner if the pain does not begin to resolve or if it worsens before the follow up       ED Prescriptions     Medication Sig Dispense Auth. Provider   naproxen (NAPROSYN) 500 MG tablet Take 1 tablet (500 mg total) by mouth 2 (two) times daily for 10 days. 20 tablet Eusebio Friendly B, PA-C   cyclobenzaprine (FLEXERIL) 10 MG tablet Take 1 tablet (10 mg total) by mouth 3 (three) times daily as needed for up to 7 days for muscle spasms. 20 tablet Gareth Morgan      PDMP not reviewed this encounter.   Shirlee Latch, PA-C 04/29/21 1015

## 2021-07-14 DIAGNOSIS — H40012 Open angle with borderline findings, low risk, left eye: Secondary | ICD-10-CM | POA: Diagnosis not present

## 2021-07-14 DIAGNOSIS — H5203 Hypermetropia, bilateral: Secondary | ICD-10-CM | POA: Diagnosis not present

## 2021-07-14 DIAGNOSIS — H04123 Dry eye syndrome of bilateral lacrimal glands: Secondary | ICD-10-CM | POA: Diagnosis not present

## 2021-07-14 DIAGNOSIS — H524 Presbyopia: Secondary | ICD-10-CM | POA: Diagnosis not present

## 2021-08-09 ENCOUNTER — Encounter: Payer: Self-pay | Admitting: Emergency Medicine

## 2021-08-09 ENCOUNTER — Ambulatory Visit (INDEPENDENT_AMBULATORY_CARE_PROVIDER_SITE_OTHER): Payer: BC Managed Care – PPO

## 2021-08-09 ENCOUNTER — Ambulatory Visit
Admission: EM | Admit: 2021-08-09 | Discharge: 2021-08-09 | Disposition: A | Discharge status: Home / Self Care | Payer: BC Managed Care – PPO | Attending: Emergency Medicine | Admitting: Emergency Medicine

## 2021-08-09 ENCOUNTER — Other Ambulatory Visit: Payer: Self-pay

## 2021-08-09 DIAGNOSIS — R519 Headache, unspecified: Secondary | ICD-10-CM

## 2021-08-09 DIAGNOSIS — M7989 Other specified soft tissue disorders: Secondary | ICD-10-CM | POA: Diagnosis not present

## 2021-08-09 DIAGNOSIS — M79671 Pain in right foot: Secondary | ICD-10-CM

## 2021-08-09 NOTE — ED Provider Notes (Signed)
[facility]    CSN: [id number] Arrival date & time: 08/09/21  1032      History   Chief Complaint Chief Complaint  Patient presents with   Foot Pain    right   Headache    HPI Chad Jenkins is a 55 y.o. male.   HPI  55 year old male here for evaluation of headache and right foot pain.  Patient reports that he has had a headache for the past 2 days and that it has resolved now.  The pain was in both of his temples and did not radiate.  There was some associated dizziness and blurry vision, both which have resolved.  Patient did not take any medications for the headache.  Patient denies chest pain, shortness of breath, nausea, or vomiting.  No history of hypertension.  Patient second complaint is pain and swelling over the lateral edge of his right foot that is been present for the past week and is not in the setting of any trauma.  He denies any numbness or tingling in his toes.  He does notice a knot on the outside of his foot that gets worse as the day goes on if he is standing on his feet for 10-hour shift.  History reviewed. No pertinent past medical history.  There are no problems to display for this patient.   Past Surgical History:  Procedure Laterality Date   NO PAST SURGERIES         Home Medications    Prior to Admission medications   Not on File    Family History Family History  Problem Relation Age of Onset   Healthy Mother    Cancer Father        brain    Social History Social History   Tobacco Use   Smoking status: Every Day    Types: Cigars   Smokeless tobacco: Never  Vaping Use   Vaping Use: Never used  Substance Use Topics   Alcohol use: Never   Drug use: Never     Allergies   Patient has no known allergies.   Review of Systems Review of Systems  Constitutional:  Negative for activity change, appetite change and fever.  Respiratory:  Negative for shortness of breath.   Cardiovascular:  Negative for chest pain.   Gastrointestinal:  Negative for nausea and vomiting.  Musculoskeletal:  Positive for arthralgias and joint swelling.  Skin:  Negative for color change and wound.  Neurological:  Positive for dizziness and headaches. Negative for syncope and weakness.  Hematological: Negative.   Psychiatric/Behavioral: Negative.      Physical Exam Triage Vital Signs ED Triage Vitals  Enc Vitals Group     BP 08/09/21 1235 105/71     Pulse Rate 08/09/21 1235 (!) 55     Resp 08/09/21 1235 18     Temp 08/09/21 1235 98.2 F (36.8 C)     Temp Source 08/09/21 1235 Oral     SpO2 08/09/21 1235 100 %     Weight 08/09/21 1233 186 lb 1.1 oz (84.4 kg)     Height 08/09/21 1233 6\' 1"  (1.854 m)     Head Circumference --      Peak Flow --      Pain Score 08/09/21 1232 6     Pain Loc --      Pain Edu? --      Excl. in GC? --    No data found.  Updated Vital Signs BP 105/71 (BP  Location: Right Arm)   Pulse (!) 55   Temp 98.2 F (36.8 C) (Oral)   Resp 18   Ht 6\' 1"  (1.854 m)   Wt 186 lb 1.1 oz (84.4 kg)   SpO2 100%   BMI 24.55 kg/m   Visual Acuity Right Eye Distance:   Left Eye Distance:   Bilateral Distance:    Right Eye Near:   Left Eye Near:    Bilateral Near:     Physical Exam Vitals and nursing note reviewed.  Constitutional:      General: He is not in acute distress.    Appearance: Normal appearance. He is not ill-appearing.  HENT:     Head: Normocephalic and atraumatic.     Nose: Nose normal. No congestion or rhinorrhea.     Mouth/Throat:     Mouth: Mucous membranes are moist.     Pharynx: Oropharynx is clear. No posterior oropharyngeal erythema.  Eyes:     General: No scleral icterus.    Extraocular Movements: Extraocular movements intact.     Pupils: Pupils are equal, round, and reactive to light.  Cardiovascular:     Rate and Rhythm: Normal rate and regular rhythm.     Pulses: Normal pulses.     Heart sounds: Normal heart sounds. No murmur heard.   No gallop.  Pulmonary:      Effort: Pulmonary effort is normal.     Breath sounds: Normal breath sounds. No wheezing, rhonchi or rales.  Musculoskeletal:        General: Swelling and tenderness present. No deformity or signs of injury. Normal range of motion.     Cervical back: Normal range of motion and neck supple.  Lymphadenopathy:     Cervical: No cervical adenopathy.  Skin:    General: Skin is warm and dry.     Capillary Refill: Capillary refill takes less than 2 seconds.     Findings: No bruising or erythema.  Neurological:     General: No focal deficit present.     Mental Status: He is alert and oriented to person, place, and time.     Cranial Nerves: No cranial nerve deficit.     Sensory: No sensory deficit.     Motor: No weakness.     Coordination: Coordination normal.     Gait: Gait normal.  Psychiatric:        Mood and Affect: Mood normal.        Thought Content: Thought content normal.        Judgment: Judgment normal.     UC Treatments / Results  Labs (all labs ordered are listed, but only abnormal results are displayed) Labs Reviewed - No data to display  EKG   Radiology DG Foot Complete Right  Result Date: 08/09/2021 CLINICAL DATA:  Pain and swelling over the fifth metatarsal base for the past week. No injury. EXAM: RIGHT FOOT COMPLETE - 3+ VIEW COMPARISON:  Right foot x-ray report dated December 12, 2004. FINDINGS: No acute fracture or dislocation. Mild first MTP joint osteoarthritis. Remaining joint spaces are preserved. Bone mineralization is normal. Soft tissues are unremarkable. Atherosclerotic calcification. IMPRESSION: 1.  No acute osseous abnormality. 2. Mild first MTP joint osteoarthritis. Electronically Signed   By: December 14, 2004 M.D.   On: 08/09/2021 13:09    Procedures Procedures (including critical care time)  Medications Ordered in UC Medications - No data to display  Initial Impression / Assessment and Plan / UC Course  I have reviewed the triage vital  signs and the  nursing notes.  Pertinent labs & imaging results that were available during my care of the patient were reviewed by me and considered in my medical decision making (see chart for details).  Patient is a nontoxic-appearing 55 year old male here for evaluation of a headache that been present for the past 2 days and is now resolved that was associated with dizziness and blurry vision yesterday.  Both those complaints and also now resolved.  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  He came back later in the day and the wait was too long so he went to [facility].  While at [facility] he had his vital signs checked which showed a blood pressure 150/136.  Patient has no history of hypertension.  Patient did not stay to be treated.  Patient's blood pressure is currently 105/71.  Patient's second complaint is pain and swelling to the lateral aspect of his right foot that has been present for the past week.  He does take bump on the lateral edge of his foot and there is a swollen area in line with the base of the fifth metatarsal.  There is no ecchymosis, edema, fluctuance, or induration present.  No erythema.  The area is mildly tender to palpation as well as the sole of the foot underlying the same area.  Patient has full range of motion and sensation of his foot and toes.  His ankle is well.  No tenderness of the phalanges, the remainder of his metatarsals or with palpation of the bones of the midfoot.  DP and PT pulses are 2+.  We will obtain radiograph of right foot to look for possible stress fracture versus degeneration.  Left foot x-rays independently reviewed and evaluated by me.  Impression: No evidence of fracture or dislocation.  Radiology overread is pending. Radiology impression is no acute osseous abnormality.  Mild first MTP  joint osteoarthritis.  Etiology of patient's right foot pain and swelling is unclear.  We will treat patient conservatively with OTC NSAIDs, ice, and elevation.  And give him contact information for podiatry for follow-up.   Final Clinical Impressions(s) / UC Diagnoses   Final diagnoses:  Foot pain, right  Acute nonintractable headache, unspecified headache type     Discharge Instructions      Your x-rays did not reveal the presence of any broken bones.  You do have some arthritis in your big toe but there is no evidence of arthritis where you are having pain and swelling.  Take over-the-counter ibuprofen according to the package instructions to help with pain and inflammation.  Ice your foot for 20 minutes at a time when you get off work to help with pain and inflammation.  Call [facility] to make an appointment to be evaluated by a podiatrist.  The numbers listed below.     ED Prescriptions   None    PDMP not reviewed this encounter.   Becky Augusta, NP 08/09/21 1329

## 2021-08-09 NOTE — ED Triage Notes (Signed)
Pt c/o headache, and left foot pain. Headache Started about 2 days ago. He states he went to the ED last night and was told his BP was elevated but did not stay to be treated. The foot pain started about a week ago.  He states the pain is located on the outer side of his foot. No known injury

## 2021-08-09 NOTE — Discharge Instructions (Addendum)
Your x-rays did not reveal the presence of any broken bones.  You do have some arthritis in your big toe but there is no evidence of arthritis where you are having pain and swelling.  Take over-the-counter ibuprofen according to the package instructions to help with pain and inflammation.  Ice your foot for 20 minutes at a time when you get off work to help with pain and inflammation.  Call [facility] to make an appointment to be evaluated by a podiatrist.  The numbers listed below.

## 2021-08-16 ENCOUNTER — Ambulatory Visit (INDEPENDENT_AMBULATORY_CARE_PROVIDER_SITE_OTHER): Payer: BC Managed Care – PPO | Admitting: Podiatry

## 2021-08-16 ENCOUNTER — Other Ambulatory Visit: Payer: Self-pay

## 2021-08-16 DIAGNOSIS — M7671 Peroneal tendinitis, right leg: Secondary | ICD-10-CM

## 2021-08-16 MED ORDER — MELOXICAM 15 MG PO TABS: 15.0000 mg | ORAL_TABLET | Freq: Every day | ORAL | 1 refills | Status: DC

## 2021-08-16 MED ORDER — BETAMETHASONE SOD PHOS & ACET 6 (3-3) MG/ML IJ SUSP
3.0000 mg | Freq: Once | INTRAMUSCULAR | Status: AC
  Administered 2021-08-16: 3 mg via INTRA_ARTICULAR

## 2021-08-16 NOTE — Progress Notes (Signed)
   HPI: 55 y.o. male presenting today as a new patient for evaluation of pain and tenderness to the right foot.  Pain has been ongoing for about 3 weeks now.  Gradual onset.  He believes that the pain started when he wore a new pair of shoes that he ordered off of [employer].  He went to the urgent care 08/09/2021 and was diagnosed with a bone spur and collapsing arches.  He was referred here.  He presents for further treatment and evaluation  No past medical history on file.   Physical Exam: General: The patient is alert and oriented x3 in no acute distress.  Dermatology: Skin is warm, dry and supple bilateral lower extremities. Negative for open lesions or macerations.  Vascular: Palpable pedal pulses bilaterally. No edema or erythema noted. Capillary refill within normal limits.  Neurological: Epicritic and protective threshold grossly intact bilaterally.   Musculoskeletal Exam: Range of motion within normal limits to all pedal and ankle joints bilateral. Muscle strength 5/5 in all groups bilateral.  Pain on palpation noted along the peroneal tendon as it inserts onto the fifth metatarsal tubercle.  Pain with direct palpation of the proximal portion of the fifth metatarsal tubercle  Radiographic Exam:  Normal osseous mineralization. Joint spaces preserved. No fracture/dislocation/boney destruction.    Assessment: 1.  Insertional peroneal tendinitis right   Plan of Care:  1. Patient evaluated. X-Rays reviewed.  2.  Injection of 0.5 cc Celestone Soluspan injected along the fifth metatarsal tubercle right 3.  Prescription for meloxicam 15 mg daily 4.  OTC power step insoles provided for the patient.  Wear daily 5.  Return to clinic in 4 weeks  *Works at [employer] (now [employer])      Felecia Shelling, DPM [facility]  Dr. Felecia Shelling, DPM    [address] [place], [place] [address]                Office [phone]   Fax [phone]

## 2021-09-16 ENCOUNTER — Other Ambulatory Visit: Payer: Self-pay

## 2021-09-16 ENCOUNTER — Ambulatory Visit (INDEPENDENT_AMBULATORY_CARE_PROVIDER_SITE_OTHER): Payer: BC Managed Care – PPO | Admitting: Podiatry

## 2021-09-16 DIAGNOSIS — M7671 Peroneal tendinitis, right leg: Secondary | ICD-10-CM | POA: Diagnosis not present

## 2021-09-19 MED ORDER — MELOXICAM 15 MG PO TABS: 15.0000 mg | ORAL_TABLET | Freq: Every day | ORAL | 1 refills | Status: DC

## 2021-09-19 NOTE — Progress Notes (Signed)
   HPI: 55 y.o. male presenting today for follow-up evaluation of insertional peroneal tendinitis to the right lower extremity.  Patient states that he is feeling much better.  The pain is significantly less than before.  He says the injection helped.  He continues to take the meloxicam daily.  He believes that the pain started when he wore a new pair of shoes that he ordered off of [employer].  He went to the urgent care 08/09/2021 and was diagnosed with a bone spur and collapsing arches.  He was referred here.    No past medical history on file.   Physical Exam: General: The patient is alert and oriented x3 in no acute distress.  Dermatology: Skin is warm, dry and supple bilateral lower extremities. Negative for open lesions or macerations.  Vascular: Palpable pedal pulses bilaterally. No edema or erythema noted. Capillary refill within normal limits.  Neurological: Epicritic and protective threshold grossly intact bilaterally.   Musculoskeletal Exam: Range of motion within normal limits to all pedal and ankle joints bilateral. Muscle strength 5/5 in all groups bilateral.  Pain on palpation noted along the peroneal tendon as it inserts onto the fifth metatarsal tubercle.  There is only some slight pain with direct palpation of the proximal portion of the fifth metatarsal tubercle  Assessment: 1.  Insertional peroneal tendinitis right   Plan of Care:  1. Patient evaluated 2.  Refill prescription for meloxicam 15 mg daily 3.  Continue OTC power step insoles 4.  Return to clinic as needed  *Works at [employer] (now [employer])      Felecia Shelling, DPM [facility]  Dr. Felecia Shelling, DPM    [address] [place], [place] [address]                Office [phone]  Fax [phone]

## 2021-10-07 ENCOUNTER — Encounter (HOSPITAL_COMMUNITY): Payer: Self-pay

## 2021-10-07 ENCOUNTER — Emergency Department (HOSPITAL_COMMUNITY)
Admission: EM | Admit: 2021-10-07 | Discharge: 2021-10-07 | Disposition: A | Discharge status: Home / Self Care | Payer: BC Managed Care – PPO | Attending: Emergency Medicine | Admitting: Emergency Medicine

## 2021-10-07 ENCOUNTER — Other Ambulatory Visit: Payer: Self-pay

## 2021-10-07 DIAGNOSIS — M79604 Pain in right leg: Secondary | ICD-10-CM | POA: Diagnosis not present

## 2021-10-07 DIAGNOSIS — M5431 Sciatica, right side: Secondary | ICD-10-CM | POA: Insufficient documentation

## 2021-10-07 DIAGNOSIS — M25551 Pain in right hip: Secondary | ICD-10-CM | POA: Insufficient documentation

## 2021-10-07 DIAGNOSIS — F17210 Nicotine dependence, cigarettes, uncomplicated: Secondary | ICD-10-CM | POA: Diagnosis not present

## 2021-10-07 DIAGNOSIS — M5432 Sciatica, left side: Secondary | ICD-10-CM | POA: Diagnosis not present

## 2021-10-07 MED ORDER — PREDNISONE 10 MG PO TABS: 20.0000 mg | ORAL_TABLET | Freq: Every day | ORAL | 0 refills | Status: AC

## 2021-10-07 MED ORDER — PREDNISONE 20 MG PO TABS
20.0000 mg | ORAL_TABLET | Freq: Once | ORAL | Status: AC
  Administered 2021-10-07: 12:00:00 20 mg via ORAL
  Filled 2021-10-07: qty 1

## 2021-10-07 NOTE — Discharge Instructions (Signed)
Return for any problem.  ?

## 2021-10-07 NOTE — ED Triage Notes (Signed)
Patient c/o right hip pain that radiates into the right leg to the right foot. Patient reports that he took a high step to get into a machine last week at work and has been hurting ever since.

## 2021-10-07 NOTE — ED Provider Notes (Signed)
[facility]-EMERGENCY DEPT Provider Note   CSN: [id number] Arrival date & time: 10/07/21  6144     History Chief Complaint  Patient presents with   Hip Pain   Leg Pain    Chad Jenkins is a 55 y.o. male.  55 year old male with prior medical history as detailed below presents for evaluation.  Patient with longstanding history of right-sided sciatica.  Patient complains of pain to the right hip.  Patient's described pain is consistent with his prior episodes of sciatica.  Patient reports increased pain with prolonged sitting.  Pain is improved with standing or with leg elevation.  Patient denies recent fever.  He denies weakness in his lower extremities.  Patient reports normal gait.  Patient reports intermittent use of Tylenol at home with minimal improvement in symptoms.  The history is provided by the patient.  Illness Location:  Right-sided hip and leg pain. Severity:  Mild Onset quality:  Gradual Duration:  1 week Progression:  Waxing and waning Chronicity:  Recurrent     History reviewed. No pertinent past medical history.  There are no problems to display for this patient.   Past Surgical History:  Procedure Laterality Date   NO PAST SURGERIES         Family History  Problem Relation Age of Onset   Healthy Mother    Cancer Father        brain    Social History   Tobacco Use   Smoking status: Every Day    Types: Cigars   Smokeless tobacco: Never  Vaping Use   Vaping Use: Never used  Substance Use Topics   Alcohol use: Never   Drug use: Never    Home Medications Prior to Admission medications   Medication Sig Start Date End Date Taking? Authorizing Provider  predniSONE (DELTASONE) 10 MG tablet Take 2 tablets (20 mg total) by mouth daily for 4 days. 10/07/21 10/11/21 Yes MessickNoralyn Pick, MD  meloxicam (MOBIC) 15 MG tablet Take 1 tablet (15 mg total) by mouth daily. 09/19/21   Felecia Shelling, DPM    Allergies    Patient has  no known allergies.  Review of Systems   Review of Systems  All other systems reviewed and are negative.  Physical Exam Updated Vital Signs BP (!) 155/86 (BP Location: Left Arm)    Pulse (!) 56    Temp 98.3 F (36.8 C) (Oral)    Resp 18    Ht 6' 1.5" (1.867 m)    Wt 77.1 kg    SpO2 100%    BMI 22.12 kg/m   Physical Exam Vitals and nursing note reviewed.  Constitutional:      General: He is not in acute distress.    Appearance: Normal appearance. He is well-developed.  HENT:     Head: Normocephalic and atraumatic.  Eyes:     Conjunctiva/sclera: Conjunctivae normal.     Pupils: Pupils are equal, round, and reactive to light.  Cardiovascular:     Rate and Rhythm: Normal rate and regular rhythm.     Heart sounds: Normal heart sounds.  Pulmonary:     Effort: Pulmonary effort is normal. No respiratory distress.     Breath sounds: Normal breath sounds.  Abdominal:     General: There is no distension.     Palpations: Abdomen is soft.     Tenderness: There is no abdominal tenderness.  Musculoskeletal:        General: No deformity. Normal range  of motion.     Cervical back: Normal range of motion and neck supple.  Skin:    General: Skin is warm and dry.  Neurological:     General: No focal deficit present.     Mental Status: He is alert and oriented to person, place, and time. Mental status is at baseline.     Cranial Nerves: No cranial nerve deficit.     Sensory: No sensory deficit.     Motor: No weakness.     Coordination: Coordination normal.    ED Results / Procedures / Treatments   Labs (all labs ordered are listed, but only abnormal results are displayed) Labs Reviewed - No data to display  EKG None  Radiology No results found.  Procedures Procedures   Medications Ordered in ED Medications  predniSONE (DELTASONE) tablet 20 mg (20 mg Oral Given 10/07/21 1142)    ED Course  I have reviewed the triage vital signs and the nursing notes.  Pertinent labs &  imaging results that were available during my care of the patient were reviewed by me and considered in my medical decision making (see chart for details).    MDM Rules/Calculators/A&P                         MDM  MSE complete  Chad Jenkins was evaluated in Emergency Department on 10/07/2021 for the symptoms described in the history of present illness. He was evaluated in the context of the global COVID-19 pandemic, which necessitated consideration that the patient might be at risk for infection with the SARS-CoV-2 virus that causes COVID-19. Institutional protocols and algorithms that pertain to the evaluation of patients at risk for COVID-19 are in a state of rapid change based on information released by regulatory bodies including the CDC and federal and state organizations. These policies and algorithms were followed during the patient's care in the ED.  Patient is presenting with complaint of right-sided sciatic pain.  Patient without lower extremity weakness, difficulty with bowel movements, difficulty with urination, or other red flag symptoms.  He is neurologically intact.  He is ambulatory without difficulty.  Patient would benefit from short course of steroids.  Patient understands need for close outpatient follow-up.  Strict return precautions given and understood.  Patient is advised that if he develops any particularly concerning symptoms he should return for ED evaluation and possible imaging.      Final Clinical Impression(s) / ED Diagnoses Final diagnoses:  Sciatica of right side    Rx / DC Orders ED Discharge Orders          Ordered    predniSONE (DELTASONE) 10 MG tablet  Daily        10/07/21 1202             Wynetta Fines, MD 10/07/21 1217

## 2021-10-07 NOTE — ED Provider Notes (Signed)
Emergency Medicine Provider Triage Evaluation Note  Chad Jenkins , a 55 y.o. male  was evaluated in triage.  Pt complains of left hip pain radiating into the left posterolateral leg. Described as sharp/shooting and causes his foot to feel numb when it occurs. He notes he took a high step to get into a machine at work last week with pain present ever since then.  Review of Systems  Positive: Left hip pain,  Negative: saddle paraesthesia, urinary/bowel dysfunction,    Physical Exam  BP (!) 155/86 (BP Location: Left Arm)    Pulse (!) 56    Temp 98.3 F (36.8 C) (Oral)    Resp 18    Ht 6' 1.5" (1.867 m)    Wt 77.1 kg    SpO2 100%    BMI 22.12 kg/m  Gen:   Awake, no distress   Resp:  Normal effort  MSK:   T-spine and L-spine nontender to palpation at midline. All joints supple and easily movable, no erythema, swelling or palpable deformity, all compartments soft. Hip ROM limited d/t pain Other:    Medical Decision Making  Medically screening exam initiated at 9:56 AM.  Appropriate orders placed.  DONTAI PEMBER was informed that the remainder of the evaluation will be completed by another provider, this initial triage assessment does not replace that evaluation, and the importance of remaining in the ED until their evaluation is complete.     Janell Quiet, [place] 10/07/21 1003    Pollyann Savoy, MD 10/07/21 [phone]

## 2021-10-22 ENCOUNTER — Ambulatory Visit (INDEPENDENT_AMBULATORY_CARE_PROVIDER_SITE_OTHER): Payer: BC Managed Care – PPO

## 2021-10-22 ENCOUNTER — Ambulatory Visit
Admission: EM | Admit: 2021-10-22 | Discharge: 2021-10-22 | Disposition: A | Discharge status: Home / Self Care | Payer: BC Managed Care – PPO | Attending: Emergency Medicine | Admitting: Emergency Medicine

## 2021-10-22 DIAGNOSIS — M25551 Pain in right hip: Secondary | ICD-10-CM

## 2021-10-22 DIAGNOSIS — M545 Low back pain, unspecified: Secondary | ICD-10-CM

## 2021-10-22 MED ORDER — HYDROCODONE-ACETAMINOPHEN 5-325 MG PO TABS: 1.0000 | ORAL_TABLET | Freq: Four times a day (QID) | ORAL | 0 refills | Status: DC | PRN
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
## 2021-10-22 NOTE — Discharge Instructions (Signed)
Your x-rays did not show any significant abnormality though you do have some bone growth on the backside of your right hip that is a result of your old trauma which may be a source of some of your pain.  Use over-the-counter Tylenol according to the package instructions as needed for mild to moderate pain.  You may also continue to take your meloxicam daily.  Take the Norco, 1 to 2 tablets every 6 hours as needed for severe pain.  This will sedate you so do not drink alcohol or drive if you take it.  Also be mindful that this product contains Tylenol and do not take more than 4000 mg of Tylenol a day.  Call [facility] Monday morning to schedule an appointment to be evaluated for possible treatment options.

## 2021-10-22 NOTE — ED Triage Notes (Signed)
Patient is here for "Back Pain". Previous injury "about 20 yrs ago" (hip dislocation). Now recently back pain "started back". Seen at [facility] for "Sciatic Pain". Not improving.

## 2021-11-02 DIAGNOSIS — M5431 Sciatica, right side: Secondary | ICD-10-CM | POA: Diagnosis not present

## 2021-11-09 DIAGNOSIS — M5431 Sciatica, right side: Secondary | ICD-10-CM | POA: Diagnosis not present

## 2021-11-14 DIAGNOSIS — M5431 Sciatica, right side: Secondary | ICD-10-CM | POA: Diagnosis not present

## 2022-02-13 ENCOUNTER — Other Ambulatory Visit: Payer: Self-pay

## 2022-02-13 ENCOUNTER — Ambulatory Visit
Admission: EM | Admit: 2022-02-13 | Discharge: 2022-02-13 | Disposition: A | Discharge status: Home / Self Care | Payer: BC Managed Care – PPO

## 2022-02-13 ENCOUNTER — Encounter: Payer: Self-pay | Admitting: Emergency Medicine

## 2022-02-13 DIAGNOSIS — M5416 Radiculopathy, lumbar region: Secondary | ICD-10-CM

## 2022-02-13 DIAGNOSIS — R634 Abnormal weight loss: Secondary | ICD-10-CM

## 2022-02-13 MED ORDER — MELOXICAM 7.5 MG PO TABS: 7.5000 mg | ORAL_TABLET | Freq: Every day | ORAL | 0 refills | Status: DC

## 2022-02-13 NOTE — ED Triage Notes (Signed)
Pt c/o right hip pain that radiates down his right leg. Started back in January. He states he has been seeing specialist for this and they say it is his back but he feels the pain in his hip. ?

## 2022-02-13 NOTE — ED Provider Notes (Signed)
?[facility] ? ? ? ?CSN: [id number] ?Arrival date & time: 02/13/22  1701 ? ? ?  ? ?History   ?Chief Complaint ?Chief Complaint  ?Patient presents with  ? Hip Pain  ? ? ?HPI ?Chad Jenkins is a 56 y.o. male who presents with  severe R hip pain radiating to his R foot, worse R leg pain since he received steroid injection yesterday. He has called them all day today and never heard back til 3 pm. His PCP call the spine surgeon, and he suggested to prescribe him Tramadol which he did not pick up yet. He states his R lateral hip, SI area and R leg hurts him. He denies back pain. Standing up after a few minutes causes him to have worse numbness and tingling of R leg and foot. He feels he is stepping on cold water on the arch of his foot. This gets better if he lays down on his L side.  ?Has been dealing with a lot of pain since January last this year and cant eat, and has lost a lot of wright. One year ago he knows he weighed about 177 lbs.  ?He has been pushing himself to work, but he could not move his foot of the car pedal to drive and his mother had to drive him here.  ? ? ?History reviewed. No pertinent past medical history. ? ?There are no problems to display for this patient. ? ? ?Past Surgical History:  ?Procedure Laterality Date  ? NO PAST SURGERIES    ? ? ? ? ? ?Home Medications   ? ?Prior to Admission medications   ?Medication Sig Start Date End Date Taking? Authorizing Provider  ?gabapentin (NEURONTIN) 300 MG capsule Take 300 mg by mouth 2 (two) times daily. 01/27/22  Yes [provider]  ?meloxicam (MOBIC) 7.5 MG tablet Take 1 tablet (7.5 mg total) by mouth daily. 02/13/22  Yes Rodriguez-Southworth, Nettie Elm, PA-C  ?tiZANidine (ZANAFLEX) 4 MG tablet Take 4 mg by mouth every 8 (eight) hours. 01/27/22  Yes [provider]  ?traMADol (ULTRAM) 50 MG tablet Take 50 mg by mouth every 6 (six) hours as needed. 01/09/22  Yes [provider]  ? ? ?Family History ?Family History  ?Problem  Relation Age of Onset  ? Healthy Mother   ? Cancer Father   ?     brain  ? ? ?Social History ?Social History  ? ?Tobacco Use  ? Smoking status: Every Day  ?  Types: Cigars  ? Smokeless tobacco: Never  ?Vaping Use  ? Vaping Use: Never used  ?Substance Use Topics  ? Alcohol use: Never  ? Drug use: Never  ? ? ? ?Allergies   ?Patient has no known allergies. ? ? ?Review of Systems ?Review of Systems  ?Constitutional:  Positive for unexpected weight change. Negative for fever.  ?Gastrointestinal:  Positive for constipation. Negative for abdominal pain.  ?     No bowel incontinence, has been very gassy. Has never had a colonoscopy  ?Genitourinary:  Negative for enuresis.  ?Neurological:  Positive for weakness and numbness.  ? ? ?Physical Exam ?Triage Vital Signs ?ED Triage Vitals  ?Enc Vitals Group  ?   BP 02/13/22 1726 117/80  ?   Pulse Rate 02/13/22 1726 60  ?   Resp 02/13/22 1726 18  ?   Temp 02/13/22 1726 98 ?F (36.7 ?C)  ?   Temp Source 02/13/22 1726 Oral  ?   SpO2 02/13/22 1726 100 %  ?  Weight 02/13/22 1723 169 lb 15.6 oz (77.1 kg)  ?   Height 02/13/22 1723 6\' 2"  (1.88 m)  ?   Head Circumference --   ?   Peak Flow --   ?   Pain Score 02/13/22 1722 8  ?   Pain Loc --   ?   Pain Edu? --   ?   Excl. in [place]? --   ? ?No data found. ?Wt 135.9  ? ?Updated Vital Signs ?BP 117/80 (BP Location: Left Arm)   Pulse 60   Temp 98 ?F (36.7 ?C) (Oral)   Resp 18   Ht 6\' 2"  (1.88 m)   Wt 135 lb 14.4 oz (61.6 kg)   SpO2 100%   BMI 17.45 kg/m?  ? ?Visual Acuity ?Right Eye Distance:   ?Left Eye Distance:   ?Bilateral Distance:   ? ?Right Eye Near:   ?Left Eye Near:    ?Bilateral Near:    ? ?Physical Exam ?Constitutional:   ?   General: He is in acute distress.  ?   Appearance: He is not toxic-appearing.  ?   Comments: Very thin who was in tears due to pain  ?HENT:  ?   Right Ear: External ear normal.  ?   Left Ear: External ear normal.  ?Eyes:  ?   General: No scleral icterus. ?   Conjunctiva/sclera: Conjunctivae normal.   ?Cardiovascular:  ?   Pulses: Normal pulses.  ?Pulmonary:  ?   Effort: Pulmonary effort is normal.  ?Abdominal:  ?   General: Abdomen is flat. Bowel sounds are normal.  ?   Palpations: Abdomen is soft. There is no mass.  ?   Tenderness: There is no abdominal tenderness. There is no guarding.  ?Musculoskeletal:  ?   Cervical back: Neck supple.  ?   Comments: BACK/ R HIP- has tenderness on R SI region, R inner iliac crest, and lateral trochanter with palpation. + SLR. ROM of hip provoked lateral hip pain and inner upper R thigh.  ?I could not test his R thigh strength due to his pain from just lifting his leg. Lower leg and foot strength 5/5.  He cant walk on his tip toe of R foot due to too much pain.   ?Skin: ?   General: Skin is warm and dry.  ?Neurological:  ?   Mental Status: He is alert and oriented to person, place, and time.  ?   Sensory: Sensory deficit present.  ?   Gait: Gait normal.  ?   Comments: R foot  ?Psychiatric:     ?   Mood and Affect: Mood normal.     ?   Behavior: Behavior normal.     ?   Thought Content: Thought content normal.     ?   Judgment: Judgment normal.  ? ? ? ?UC Treatments / Results  ?Labs ?(all labs ordered are listed, but only abnormal results are displayed) ?Labs Reviewed - No data to display ? ?EKG ? ? ?Radiology ?No results found. ? ?Procedures ?Procedures (including critical care time) ? ?Medications Ordered in UC ?Medications - No data to display ? ?Initial Impression / Assessment and Plan / UC Course  ?I have reviewed the triage vital signs and the nursing notes. ?R lumbar radiculopathy ?I advised pt to take the Tramadol q 6h, and if this does not give him enough relieve may double his Gabapentin to 600 mg tonight, then every am til he talks with his surgeon this week.  ? ? ? ? ?  Final Clinical Impressions(s) / UC Diagnoses  ? ?Final diagnoses:  ?Lumbar radiculopathy, right  ?Loss of weight  ? ? ? ?Discharge Instructions   ? ?  ?Increase the Gabapentin to 600 during the day.   ? ?Take the tramadol as prescribed and make sure you take  ? ? ? ? ?ED Prescriptions   ? ? Medication Sig Dispense Auth. Provider  ? meloxicam (MOBIC) 7.5 MG tablet Take 1 tablet (7.5 mg total) by mouth daily. 1 tablet Rodriguez-Southworth, Nettie ElmSylvia, PA-C  ? ?  ? ?PDMP not reviewed this encounter. ?  ?Garey HamRodriguez-Southworth, Kenzie Flakes, PA-C ?02/13/22 2039 ? ?

## 2022-02-13 NOTE — Discharge Instructions (Signed)
Increase the Gabapentin to 600 during the day.  ? ?Take the tramadol as prescribed and make sure you take  ?

## 2022-05-08 DIAGNOSIS — H524 Presbyopia: Secondary | ICD-10-CM | POA: Diagnosis not present

## 2022-05-08 DIAGNOSIS — H40012 Open angle with borderline findings, low risk, left eye: Secondary | ICD-10-CM | POA: Diagnosis not present

## 2022-05-08 DIAGNOSIS — H04123 Dry eye syndrome of bilateral lacrimal glands: Secondary | ICD-10-CM | POA: Diagnosis not present

## 2022-05-08 DIAGNOSIS — H5203 Hypermetropia, bilateral: Secondary | ICD-10-CM | POA: Diagnosis not present

## 2022-07-28 ENCOUNTER — Ambulatory Visit
Admission: EM | Admit: 2022-07-28 | Discharge: 2022-07-28 | Disposition: A | Discharge status: Home / Self Care | Payer: BC Managed Care – PPO | Attending: Emergency Medicine | Admitting: Emergency Medicine

## 2022-07-28 DIAGNOSIS — M5441 Lumbago with sciatica, right side: Secondary | ICD-10-CM | POA: Diagnosis not present

## 2022-07-28 DIAGNOSIS — Z202 Contact with and (suspected) exposure to infections with a predominantly sexual mode of transmission: Secondary | ICD-10-CM | POA: Insufficient documentation

## 2022-07-28 LAB — URINALYSIS, MICROSCOPIC (REFLEX)

## 2022-07-28 LAB — URINALYSIS, ROUTINE W REFLEX MICROSCOPIC
Glucose, UA: NEGATIVE mg/dL
Hgb urine dipstick: NEGATIVE
Leukocytes,Ua: NEGATIVE
Nitrite: NEGATIVE
Protein, ur: 100 mg/dL — AB
Specific Gravity, Urine: >1.03 — ABNORMAL HIGH (ref 1.005–1.030)
pH: 5.5 (ref 5.0–8.0)

## 2022-07-28 MED ORDER — DEXAMETHASONE SODIUM PHOSPHATE 10 MG/ML IJ SOLN
10.0000 mg | Freq: Once | INTRAMUSCULAR | Status: AC
  Administered 2022-07-28: 10 mg via INTRAMUSCULAR

## 2022-07-28 MED ORDER — PREDNISONE 10 MG (21) PO TBPK: ORAL_TABLET | ORAL | 0 refills | Status: DC

## 2022-07-28 MED ORDER — METRONIDAZOLE 500 MG PO TABS: 500.0000 mg | ORAL_TABLET | Freq: Two times a day (BID) | ORAL | 0 refills | Status: DC

## 2022-07-28 NOTE — ED Triage Notes (Signed)
Pt c/o lower back pain, pt reports he does have a bulging disc. Pt also reports some dysuria x couple days, urinary frequency   Pt also states wife tested positive for trichomonas x2 days ago. Pt requesting to be checked for STD.

## 2022-07-28 NOTE — Discharge Instructions (Addendum)
Take the prednisone starting tomorrow morning. Take it each morning at breakfast.  Take the Tizanidine you have been prescribed for muscle spasm.  Apply moist heat to your back for 30 minutes at a time 2-3 times a day to improve blood flow to the area and help remove the lactic acid causing the spasm.  Follow the back exercises given at discharge.  Take the Metronidazole twice daily for 7 days for treatment of your trichomonas exposure.  Avoid unprotected sexy until after you have finished your treatment.  If your testing comes back positive for other STI's you will be called and offered treatment options.  Return for reevaluation for any new or worsening symptoms.

## 2022-07-28 NOTE — ED Provider Notes (Signed)
[facility]    CSN: [id number] Arrival date & time: 07/28/22  1424      History   Chief Complaint Chief Complaint  Patient presents with   Back Pain   Dysuria   STD Check    HPI Chad Jenkins is a 57 y.o. male.   HPI  56 year old male here for evaluation of multiple complaints.  Patient reports that for the last 3 days he has been experiencing right-sided low back pain that radiates down the back and outside of his right leg to his toes.  He states that this is typical of his back pain flares as he has bulging disks.  Denies any injuries but states he has been doing heavy lifting at work.  In addition he is also been experiencing burning with urination for the past couple days along with urinary frequency.  A third issue is that his wife had a recent GYN exam and tested positive for trichomonas.  Patient denies any penile discharge.  History reviewed. No pertinent past medical history.  There are no problems to display for this patient.   Past Surgical History:  Procedure Laterality Date   NO PAST SURGERIES         Home Medications    Prior to Admission medications   Medication Sig Start Date End Date Taking? Authorizing Provider  gabapentin (NEURONTIN) 300 MG capsule Take 300 mg by mouth 2 (two) times daily. 01/27/22  Yes [provider]  metroNIDAZOLE (FLAGYL) 500 MG tablet Take 1 tablet (500 mg total) by mouth 2 (two) times daily. 07/28/22  Yes Becky Augusta, NP  predniSONE (STERAPRED UNI-PAK 21 TAB) 10 MG (21) TBPK tablet Take 6 tablets on day 1, 5 tablets day 2, 4 tablets day 3, 3 tablets day 4, 2 tablets day 5, 1 tablet day 6 07/28/22  Yes Becky Augusta, NP  tiZANidine (ZANAFLEX) 4 MG tablet Take 4 mg by mouth every 8 (eight) hours. 01/27/22  Yes [provider]  meloxicam (MOBIC) 7.5 MG tablet Take 1 tablet (7.5 mg total) by mouth daily. 02/13/22   Rodriguez-Southworth, Nettie Elm, PA-C  traMADol (ULTRAM) 50 MG tablet Take 50 mg by mouth  every 6 (six) hours as needed. 01/09/22   [provider]    Family History Family History  Problem Relation Age of Onset   Healthy Mother    Cancer Father        brain    Social History Social History   Tobacco Use   Smoking status: Every Day    Types: Cigars   Smokeless tobacco: Never  Vaping Use   Vaping Use: Never used  Substance Use Topics   Alcohol use: Never   Drug use: Never     Allergies   Patient has no known allergies.   Review of Systems Review of Systems  Genitourinary:  Positive for dysuria and frequency. Negative for hematuria, penile discharge and urgency.  Musculoskeletal:  Positive for back pain.     Physical Exam Triage Vital Signs ED Triage Vitals  Enc Vitals Group     BP 07/28/22 1438 123/87     Pulse Rate 07/28/22 1438 66     Resp --      Temp 07/28/22 1438 98.1 F (36.7 C)     Temp Source 07/28/22 1438 Oral     SpO2 07/28/22 1438 97 %     Weight 07/28/22 1436 143 lb 3.2 oz (65 kg)     Height 07/28/22 1436 6' 1.5" (1.867  m)     Head Circumference --      Peak Flow --      Pain Score 07/28/22 1435 7     Pain Loc --      Pain Edu? --      Excl. in [place]? --    No data found.  Updated Vital Signs BP 123/87 (BP Location: Left Arm)   Pulse 66   Temp 98.1 F (36.7 C) (Oral)   Ht 6' 1.5" (1.867 m)   Wt 143 lb 3.2 oz (65 kg)   SpO2 97%   BMI 18.64 kg/m   Visual Acuity Right Eye Distance:   Left Eye Distance:   Bilateral Distance:    Right Eye Near:   Left Eye Near:    Bilateral Near:     Physical Exam Vitals and nursing note reviewed.  Constitutional:      Appearance: Normal appearance.  HENT:     Head: Normocephalic and atraumatic.  Cardiovascular:     Rate and Rhythm: Normal rate and regular rhythm.     Pulses: Normal pulses.     Heart sounds: Normal heart sounds. No murmur heard.    No friction rub. No gallop.  Pulmonary:     Effort: Pulmonary effort is normal.     Breath sounds: Normal breath sounds. No  wheezing, rhonchi or rales.  Musculoskeletal:        General: Tenderness present. No swelling, deformity or signs of injury. Normal range of motion.  Skin:    General: Skin is warm and dry.     Capillary Refill: Capillary refill takes less than 2 seconds.  Neurological:     General: No focal deficit present.     Mental Status: He is alert and oriented to person, place, and time.  Psychiatric:        Mood and Affect: Mood normal.        Behavior: Behavior normal.        Thought Content: Thought content normal.        Judgment: Judgment normal.      UC Treatments / Results  Labs (all labs ordered are listed, but only abnormal results are displayed) Labs Reviewed  URINALYSIS, ROUTINE W REFLEX MICROSCOPIC - Abnormal; Notable for the following components:      Result Value   Specific Gravity, Urine >1.030 (*)    Bilirubin Urine SMALL (*)    Ketones, ur TRACE (*)    Protein, ur 100 (*)    All other components within normal limits  URINALYSIS, MICROSCOPIC (REFLEX) - Abnormal; Notable for the following components:   Bacteria, UA FEW (*)    All other components within normal limits  CYTOLOGY, (ORAL, ANAL, URETHRAL) ANCILLARY ONLY    EKG   Radiology No results found.  Procedures Procedures (including critical care time)  Medications Ordered in UC Medications  dexamethasone (DECADRON) injection 10 mg (10 mg Intramuscular Given 07/28/22 1610)    Initial Impression / Assessment and Plan / UC Course  I have reviewed the triage vital signs and the nursing notes.  Pertinent labs & imaging results that were available during my care of the patient were reviewed by me and considered in my medical decision making (see chart for details).   Patient is a nontoxic-appearing 56 year old male here for evaluation of multiple complaints as outlined HPI above.  Interpreter the patient's back pain patient has tenderness with palpation of the right lower lumbar paraspinous in the gluteal  region.  No significant spasm  present but he does have muscle tension.  His straight leg raise is negative bilaterally.  Bilateral lower extremity strength is 5/5.  I suspect that patient's back pain is musculoskeletal in nature.  He is already taking meloxicam and tizanidine without improvement of his symptoms.  I will have him continue the tizanidine and do a short course of prednisone at home in conjunction with a shot of Decadron here in clinic before he leaves.  A urinalysis was collected at triage and shows a high specific gravity of >1.030, small bilirubin, trace ketones, and 100 protein.  No glucose, hemoglobin, nitrites, or leukocyte esterase.  Reflex microscopy shows few bacteria but no WBCs or squamous epithelials.  Patient's dysuria may be coming from concentrated urine as his sample does show dehydration.  May also be secondary to his trichomonas exposure.  I will have him increase his oral fluid intake so that he increases his urine production and helps flush undiluted system.  I will order a penile swab look for the presence of gonorrhea, chlamydia, or trichomonas given his exposure.  I will also discharge him home on metronidazole twice daily for 7 days for treatment of presumptive trichomonas while his STI swab is pending.   Final Clinical Impressions(s) / UC Diagnoses   Final diagnoses:  Acute right-sided low back pain with right-sided sciatica  Trichomonas exposure     Discharge Instructions      Take the prednisone starting tomorrow morning. Take it each morning at breakfast.  Take the Tizanidine you have been prescribed for muscle spasm.  Apply moist heat to your back for 30 minutes at a time 2-3 times a day to improve blood flow to the area and help remove the lactic acid causing the spasm.  Follow the back exercises given at discharge.  Take the Metronidazole twice daily for 7 days for treatment of your trichomonas exposure.  Avoid unprotected sexy until after you  have finished your treatment.  If your testing comes back positive for other STI's you will be called and offered treatment options.  Return for reevaluation for any new or worsening symptoms.      ED Prescriptions     Medication Sig Dispense Auth. Provider   predniSONE (STERAPRED UNI-PAK 21 TAB) 10 MG (21) TBPK tablet Take 6 tablets on day 1, 5 tablets day 2, 4 tablets day 3, 3 tablets day 4, 2 tablets day 5, 1 tablet day 6 21 tablet Becky Augusta, NP   metroNIDAZOLE (FLAGYL) 500 MG tablet Take 1 tablet (500 mg total) by mouth 2 (two) times daily. 14 tablet Becky Augusta, NP      PDMP not reviewed this encounter.   Becky Augusta, NP 07/28/22 1614

## 2022-07-31 LAB — CYTOLOGY, (ORAL, ANAL, URETHRAL) ANCILLARY ONLY
Chlamydia: NEGATIVE
Comment: NEGATIVE
Comment: NEGATIVE
Comment: NORMAL
Neisseria Gonorrhea: NEGATIVE
Trichomonas: NEGATIVE

## 2022-09-05 ENCOUNTER — Other Ambulatory Visit: Payer: Self-pay

## 2022-09-05 ENCOUNTER — Encounter: Payer: Self-pay | Admitting: Emergency Medicine

## 2022-09-05 ENCOUNTER — Ambulatory Visit
Admission: EM | Admit: 2022-09-05 | Discharge: 2022-09-05 | Disposition: A | Discharge status: Home / Self Care | Payer: BC Managed Care – PPO | Attending: Physician Assistant | Admitting: Physician Assistant

## 2022-09-05 DIAGNOSIS — G44209 Tension-type headache, unspecified, not intractable: Secondary | ICD-10-CM

## 2022-09-05 MED ORDER — HYDROXYZINE PAMOATE 25 MG PO CAPS: 25.0000 mg | ORAL_CAPSULE | Freq: Every day | ORAL | 0 refills | Status: AC

## 2022-09-05 NOTE — Discharge Instructions (Addendum)
Return if any problems.  See your Physicain for recheck  
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
## 2022-12-05 ENCOUNTER — Other Ambulatory Visit: Payer: Self-pay

## 2022-12-05 ENCOUNTER — Ambulatory Visit
Admission: EM | Admit: 2022-12-05 | Discharge: 2022-12-05 | Disposition: A | Discharge status: Home / Self Care | Payer: BC Managed Care – PPO | Attending: Physician Assistant | Admitting: Physician Assistant

## 2022-12-05 DIAGNOSIS — M5441 Lumbago with sciatica, right side: Secondary | ICD-10-CM

## 2022-12-05 DIAGNOSIS — G8929 Other chronic pain: Secondary | ICD-10-CM | POA: Diagnosis not present

## 2022-12-05 HISTORY — DX: Other intervertebral disc degeneration, lumbar region without mention of lumbar back pain or lower extremity pain: M51.369

## 2022-12-05 HISTORY — DX: Other intervertebral disc degeneration, lumbar region: M51.36

## 2022-12-05 MED ORDER — PREDNISONE 10 MG PO TABS: ORAL_TABLET | ORAL | 0 refills | Status: DC

## 2022-12-05 MED ORDER — KETOROLAC TROMETHAMINE 60 MG/2ML IM SOLN
30.0000 mg | Freq: Once | INTRAMUSCULAR | Status: AC
  Administered 2022-12-05: 30 mg via INTRAMUSCULAR

## 2022-12-05 MED ORDER — TIZANIDINE HCL 4 MG PO TABS: 4.0000 mg | ORAL_TABLET | Freq: Three times a day (TID) | ORAL | 0 refills | Status: AC | PRN

## 2022-12-05 MED ORDER — GABAPENTIN 300 MG PO CAPS: 300.0000 mg | ORAL_CAPSULE | Freq: Three times a day (TID) | ORAL | 0 refills | Status: DC

## 2022-12-05 NOTE — ED Triage Notes (Signed)
Pt has hx of 3 bulging disk and states over the last couple of days pain has been getting progressively worse. No relief from home meds. Pain radiating down legs into feet making it painful to walk

## 2022-12-05 NOTE — ED Provider Notes (Signed)
[facility]    CSN: [id number] Arrival date & time: 12/05/22  1229      History   Chief Complaint Chief Complaint  Patient presents with   Back Pain    HPI Chad Jenkins is a 57 y.o. male presenting for acute flare of chronic back pain that he has had for the past 2 years.  Reports he has been seen by orthopedics last summer and was told he had 3 bulging disc in his back.  He reports that he had epidural corticosteroid injections which were helpful.  He had a flareup in October of last year and reports taking steroids.  Patient says he was taking 300 mg gabapentin twice a day but after the corticosteroids he was able to reduce it to once a day.  He says over the past 2 to 3 days the pain has gotten worse.  He feels pain in the right lower back that radiates all the way down to his foot and affects all of his toes.  He also reports some occasional tingling but denies any weakness.  No falls.  Denies any repetitive bending over but says he does have to twist a lot at work.  Reports he has also tried OTC meds recently without relief.  No report of loss of bowel or bladder control.  No other complaints.  HPI  Past Medical History:  Diagnosis Date   Bulging lumbar disc     There are no problems to display for this patient.   Past Surgical History:  Procedure Laterality Date   NO PAST SURGERIES         Home Medications    Prior to Admission medications   Medication Sig Start Date End Date Taking? Authorizing Provider  gabapentin (NEURONTIN) 300 MG capsule Take 1 capsule (300 mg total) by mouth 3 (three) times daily. 12/05/22 01/04/23 Yes Danton Clap, PA-C  predniSONE (DELTASONE) 10 MG tablet Take 6 tabs p.o. on day 1 and decrease by 1 tablet daily until complete 12/05/22  Yes Laurene Footman B, PA-C  tiZANidine (ZANAFLEX) 4 MG tablet Take 1 tablet (4 mg total) by mouth 3 (three) times daily as needed for up to 7 days for muscle spasms. 12/05/22 12/12/22 Yes Danton Clap, PA-C  metroNIDAZOLE (FLAGYL) 500 MG tablet Take 1 tablet (500 mg total) by mouth 2 (two) times daily. Patient not taking: Reported on 09/05/2022 07/28/22   Margarette Canada, NP    Family History Family History  Problem Relation Age of Onset   Healthy Mother    Cancer Father        brain    Social History Social History   Tobacco Use   Smoking status: Every Day    Types: Cigars   Smokeless tobacco: Never  Vaping Use   Vaping Use: Never used  Substance Use Topics   Alcohol use: Never   Drug use: Never     Allergies   Patient has no known allergies.   Review of Systems Review of Systems  Musculoskeletal:  Positive for back pain. Negative for arthralgias, gait problem and joint swelling.  Neurological:  Negative for weakness and numbness.     Physical Exam Triage Vital Signs ED Triage Vitals [12/05/22 1239]  Enc Vitals Group     BP      Pulse      Resp      Temp      Temp src      SpO2  Weight      Height      Head Circumference      Peak Flow      Pain Score 7     Pain Loc      Pain Edu?      Excl. in [place]?    No data found.  Updated Vital Signs BP 108/77   Pulse 60   Temp 98.1 F (36.7 C)   Resp 20   SpO2 100%      Physical Exam Vitals and nursing note reviewed.  Constitutional:      General: He is not in acute distress.    Appearance: Normal appearance. He is well-developed. He is not ill-appearing.  HENT:     Head: Normocephalic and atraumatic.  Eyes:     General: No scleral icterus.    Conjunctiva/sclera: Conjunctivae normal.  Cardiovascular:     Rate and Rhythm: Normal rate and regular rhythm.     Heart sounds: Normal heart sounds.  Pulmonary:     Effort: Pulmonary effort is normal. No respiratory distress.     Breath sounds: Normal breath sounds.  Musculoskeletal:     Cervical back: Neck supple.     Lumbar back: Tenderness (right paralumbar muscles and buttocks) present. No bony tenderness. Decreased range of motion. Positive  right straight leg raise test. Negative left straight leg raise test.  Skin:    General: Skin is warm and dry.     Capillary Refill: Capillary refill takes less than 2 seconds.  Neurological:     General: No focal deficit present.     Mental Status: He is alert. Mental status is at baseline.     Motor: No weakness.     Gait: Gait normal.  Psychiatric:        Mood and Affect: Mood normal.        Behavior: Behavior normal.      UC Treatments / Results  Labs (all labs ordered are listed, but only abnormal results are displayed) Labs Reviewed - No data to display  EKG   Radiology No results found.  Procedures Procedures (including critical care time)  Medications Ordered in UC Medications  ketorolac (TORADOL) injection 30 mg (30 mg Intramuscular Given 12/05/22 1306)    Initial Impression / Assessment and Plan / UC Course  I have reviewed the triage vital signs and the nursing notes.  Pertinent labs & imaging results that were available during my care of the patient were reviewed by me and considered in my medical decision making (see chart for details).   57 year old male presents for acute flareup of chronic low back pain.  History of right-sided sciatica for the past couple years.  Has seen orthopedics last summer.  Currently taking gabapentin 3 mg once a day.  Increased pain over the past couple days.  No red flag signs or symptoms.  Treating acute on chronic flare with 30 mg injection ketorolac given in clinic.  Prescription for prednisone taper, tizanidine and gabapentin.  Gabapentin dose increased from once a day to 3 times a day as needed.  Encouraged him to make an appointment with orthopedics as soon as possible for an appointment.  He likely needs another MRI performed as it has been a while since his last one.  ED precautions given.  Work note given.   Final Clinical Impressions(s) / UC Diagnoses   Final diagnoses:  Chronic right-sided low back pain with  right-sided sciatica     Discharge Instructions      -  Make a follow-up appointment with orthopedics. - I have increased her gabapentin from once a day to 3 times a day.  Take it twice a day for the next 2 days and then you may increase the dose to 3 times a day.  Or, as we discussed you may take 1 tablet in the morning and 2 tablets at bedtime. - Start the corticosteroid taper.  May also take Tylenol for pain.  I sent a muscle relaxer too. - Heat, ice, muscle rubs, lidocaine patches. - You probably need another MRI and I would ask for surgery consult.     ED Prescriptions     Medication Sig Dispense Auth. Provider   gabapentin (NEURONTIN) 300 MG capsule Take 1 capsule (300 mg total) by mouth 3 (three) times daily. 90 capsule Laurene Footman B, PA-C   predniSONE (DELTASONE) 10 MG tablet Take 6 tabs p.o. on day 1 and decrease by 1 tablet daily until complete 21 tablet Laurene Footman B, PA-C   tiZANidine (ZANAFLEX) 4 MG tablet Take 1 tablet (4 mg total) by mouth 3 (three) times daily as needed for up to 7 days for muscle spasms. 20 tablet Danton Clap, PA-C      I have reviewed the PDMP during this encounter.   Danton Clap, PA-C 12/05/22 1313

## 2022-12-05 NOTE — Discharge Instructions (Signed)
-  Make a follow-up appointment with orthopedics. - I have increased her gabapentin from once a day to 3 times a day.  Take it twice a day for the next 2 days and then you may increase the dose to 3 times a day.  Or, as we discussed you may take 1 tablet in the morning and 2 tablets at bedtime. - Start the corticosteroid taper.  May also take Tylenol for pain.  I sent a muscle relaxer too. - Heat, ice, muscle rubs, lidocaine patches. - You probably need another MRI and I would ask for surgery consult.

## 2022-12-05 NOTE — ED Triage Notes (Signed)
Pt states he has 3 bulging disk and back pain has been getting worse over the last 3 days. Home meds not working at this time.

## 2023-01-05 ENCOUNTER — Ambulatory Visit
Admission: EM | Admit: 2023-01-05 | Discharge: 2023-01-05 | Disposition: A | Discharge status: Home / Self Care | Payer: BC Managed Care – PPO | Attending: Emergency Medicine | Admitting: Emergency Medicine

## 2023-01-05 DIAGNOSIS — J069 Acute upper respiratory infection, unspecified: Secondary | ICD-10-CM

## 2023-01-05 LAB — RAPID INFLUENZA A&B ANTIGENS
Influenza A (ARMC): NEGATIVE
Influenza B (ARMC): NEGATIVE

## 2023-01-05 NOTE — ED Triage Notes (Signed)
Pt c/o cough, abdominal pain, sore throat, body chills, x1day  Pt states that symptoms started at 11:30pm last night after getting of work.   Pt was around wife who was sick but did not get checked for anything.   Pt declines testing for covid and asks for a flu test to be done.

## 2023-01-05 NOTE — Discharge Instructions (Signed)
Your symptoms today are most likely being caused by a virus and should steadily improve in time it can take up to 7 to 10 days before you truly start to see a turnaround however things will get better  Flu testing is negative    You can take Tylenol and/or Ibuprofen as needed for fever reduction and pain relief.   For cough: honey 1/2 to 1 teaspoon (you can dilute the honey in water or another fluid).  You can also use guaifenesin and dextromethorphan for cough. You can use a humidifier for chest congestion and cough.  If you don't have a humidifier, you can sit in the bathroom with the hot shower running.      For sore throat: try warm salt water gargles, cepacol lozenges, throat spray, warm tea or water with lemon/honey, popsicles or ice, or OTC cold relief medicine for throat discomfort.   For congestion: take a daily anti-histamine like Zyrtec, Claritin, and a oral decongestant, such as pseudoephedrine.  You can also use Flonase 1-2 sprays in each nostril daily.   It is important to stay hydrated: drink plenty of fluids (water, gatorade/powerade/pedialyte, juices, or teas) to keep your throat moisturized and help further relieve irritation/discomfort.

## 2023-01-05 NOTE — ED Provider Notes (Signed)
[facility]    CSN: [id number] Arrival date & time: 01/05/23  1441      History   Chief Complaint No chief complaint on file.   HPI Chad Jenkins is a 57 y.o. male.   Patient presents for evaluation of nasal congestion, rhinorrhea, sore throat, body aches, nonproductive cough.  Known sick contact within the tolerating food and liquids.  Has not attempted treatment of symptoms.  Denies fevers, shortness of breath or wheezing.   Past Medical History:  Diagnosis Date   Bulging lumbar disc     There are no problems to display for this patient.   Past Surgical History:  Procedure Laterality Date   NO PAST SURGERIES         Home Medications    Prior to Admission medications   Medication Sig Start Date End Date Taking? Authorizing Provider  metroNIDAZOLE (FLAGYL) 500 MG tablet Take 1 tablet (500 mg total) by mouth 2 (two) times daily. 07/28/22  Yes Margarette Canada, NP  predniSONE (DELTASONE) 10 MG tablet Take 6 tabs p.o. on day 1 and decrease by 1 tablet daily until complete 12/05/22  Yes Laurene Footman B, PA-C  gabapentin (NEURONTIN) 300 MG capsule Take 1 capsule (300 mg total) by mouth 3 (three) times daily. 12/05/22 01/04/23  Danton Clap, PA-C    Family History Family History  Problem Relation Age of Onset   Healthy Mother    Cancer Father        brain    Social History Social History   Tobacco Use   Smoking status: Every Day    Types: Cigars   Smokeless tobacco: Never  Vaping Use   Vaping Use: Never used  Substance Use Topics   Alcohol use: Never   Drug use: Never     Allergies   Patient has no known allergies.   Review of Systems Review of Systems  Constitutional: Negative.   HENT:  Positive for congestion, rhinorrhea and sore throat. Negative for dental problem, drooling, ear discharge, ear pain, facial swelling, hearing loss, mouth sores, nosebleeds, postnasal drip, sinus pressure, sinus pain, sneezing, tinnitus, trouble swallowing  and voice change.   Respiratory:  Positive for cough. Negative for apnea, choking, chest tightness, shortness of breath, wheezing and stridor.   Cardiovascular: Negative.   Gastrointestinal:  Positive for nausea. Negative for abdominal distention, abdominal pain, anal bleeding, blood in stool, constipation, diarrhea, rectal pain and vomiting.  Musculoskeletal:  Positive for myalgias. Negative for arthralgias, back pain, gait problem, joint swelling, neck pain and neck stiffness.  Skin: Negative.      Physical Exam Triage Vital Signs ED Triage Vitals  Enc Vitals Group     BP 01/05/23 1535 101/62     Pulse Rate 01/05/23 1535 (!) 51     Resp --      Temp 01/05/23 1535 98.6 F (37 C)     Temp Source 01/05/23 1535 Oral     SpO2 01/05/23 1535 99 %     Weight 01/05/23 1534 145 lb (65.8 kg)     Height 01/05/23 1534 6\' 1"  (1.854 m)     Head Circumference --      Peak Flow --      Pain Score 01/05/23 1533 6     Pain Loc --      Pain Edu? --      Excl. in [place]? --    No data found.  Updated Vital Signs BP 101/62 (BP Location: Left Arm)  Pulse (!) 51   Temp 98.6 F (37 C) (Oral)   Ht 6\' 1"  (1.854 m)   Wt 145 lb (65.8 kg)   SpO2 99%   BMI 19.13 kg/m   Visual Acuity Right Eye Distance:   Left Eye Distance:   Bilateral Distance:    Right Eye Near:   Left Eye Near:    Bilateral Near:     Physical Exam Constitutional:      Appearance: Normal appearance.  HENT:     Head: Normocephalic.     Right Ear: Tympanic membrane, ear canal and external ear normal.     Left Ear: Tympanic membrane, ear canal and external ear normal.     Nose: Congestion present. No rhinorrhea.     Mouth/Throat:     Mouth: Mucous membranes are moist.     Pharynx: No posterior oropharyngeal erythema.  Eyes:     Extraocular Movements: Extraocular movements intact.  Cardiovascular:     Rate and Rhythm: Normal rate and regular rhythm.     Pulses: Normal pulses.     Heart sounds: Normal heart sounds.   Pulmonary:     Effort: Pulmonary effort is normal.     Breath sounds: Normal breath sounds.  Skin:    General: Skin is warm and dry.  Neurological:     Mental Status: He is alert and oriented to person, place, and time. Mental status is at baseline.      UC Treatments / Results  Labs (all labs ordered are listed, but only abnormal results are displayed) Labs Reviewed  RAPID INFLUENZA A&B ANTIGENS    EKG   Radiology No results found.  Procedures Procedures (including critical care time)  Medications Ordered in UC Medications - No data to display  Initial Impression / Assessment and Plan / UC Course  I have reviewed the triage vital signs and the nursing notes.  Pertinent labs & imaging results that were available during my care of the patient were reviewed by me and considered in my medical decision making (see chart for details).  Viral URI with cough  Patient is in no signs of distress nor toxic appearing.  Vital signs are stable.  Low suspicion for pneumonia, pneumothorax or bronchitis and therefore will defer imaging.  Flu test negative.  Declined COVID testing.May use additional over-the-counter medications as needed for supportive care.  May follow-up with urgent care as needed if symptoms persist or worsen.  Note given.    Final Clinical Impressions(s) / UC Diagnoses   Final diagnoses:  None   Discharge Instructions   None    ED Prescriptions   None    PDMP not reviewed this encounter.   Hans Eden, NP 01/05/23 [phone]

## 2023-02-22 DIAGNOSIS — M5136 Other intervertebral disc degeneration, lumbar region: Secondary | ICD-10-CM | POA: Diagnosis not present

## 2023-02-22 DIAGNOSIS — M51369 Other intervertebral disc degeneration, lumbar region without mention of lumbar back pain or lower extremity pain: Secondary | ICD-10-CM | POA: Insufficient documentation

## 2023-02-27 DIAGNOSIS — H524 Presbyopia: Secondary | ICD-10-CM | POA: Diagnosis not present

## 2023-02-27 DIAGNOSIS — H04123 Dry eye syndrome of bilateral lacrimal glands: Secondary | ICD-10-CM | POA: Diagnosis not present

## 2023-02-27 DIAGNOSIS — H40012 Open angle with borderline findings, low risk, left eye: Secondary | ICD-10-CM | POA: Diagnosis not present

## 2023-02-27 DIAGNOSIS — H5203 Hypermetropia, bilateral: Secondary | ICD-10-CM | POA: Diagnosis not present

## 2023-02-27 DIAGNOSIS — H401131 Primary open-angle glaucoma, bilateral, mild stage: Secondary | ICD-10-CM | POA: Diagnosis not present

## 2023-03-09 DIAGNOSIS — M5416 Radiculopathy, lumbar region: Secondary | ICD-10-CM | POA: Diagnosis not present

## 2023-03-18 NOTE — Progress Notes (Unsigned)
New patient visit  Patient: Chad Jenkins   DOB: [birth year]   57 y.o. Male  MRN: [id number] Visit Date: 03/19/2023  Today's healthcare provider: Debera Lat, PA-C   No chief complaint on file.  Subjective    Chad Jenkins is a 57 y.o. male who presents today as a new patient to establish care.  HPI  ***  Past Medical History:  Diagnosis Date   Bulging lumbar disc    Past Surgical History:  Procedure Laterality Date   NO PAST SURGERIES     Family Status  Relation Name Status   Mother  Alive   Father  Deceased   Family History  Problem Relation Age of Onset   Healthy Mother    Cancer Father        brain   Social History   Socioeconomic History   Marital status: Married    Spouse name: Not on file   Number of children: Not on file   Years of education: Not on file   Highest education level: Not on file  Occupational History   Not on file  Tobacco Use   Smoking status: Every Day    Types: Cigars   Smokeless tobacco: Never  Vaping Use   Vaping Use: Never used  Substance and Sexual Activity   Alcohol use: Never   Drug use: Never   Sexual activity: Not on file  Other Topics Concern   Not on file  Social History Narrative   Not on file   Social Determinants of Health   Financial Resource Strain: Not on file  Food Insecurity: Not on file  Transportation Needs: Not on file  Physical Activity: Not on file  Stress: Not on file  Social Connections: Not on file   Outpatient Medications Prior to Visit  Medication Sig   gabapentin (NEURONTIN) 300 MG capsule Take 1 capsule (300 mg total) by mouth 3 (three) times daily.   metroNIDAZOLE (FLAGYL) 500 MG tablet Take 1 tablet (500 mg total) by mouth 2 (two) times daily.   predniSONE (DELTASONE) 10 MG tablet Take 6 tabs p.o. on day 1 and decrease by 1 tablet daily until complete   No facility-administered medications prior to visit.   No Known Allergies   There is no immunization history on file for this  patient.  Health Maintenance  Topic Date Due   COVID-19 Vaccine (1) Never done   HIV Screening  Never done   Hepatitis C Screening  Never done   DTaP/Tdap/Td (1 - Tdap) Never done   Colonoscopy  Never done   Zoster Vaccines- Shingrix (1 of 2) Never done   INFLUENZA VACCINE  05/10/2023   HPV VACCINES  Aged Out    Patient Care Team: [facility] as PCP - General  Review of Systems Except see HPI   {Labs  Heme  Chem  Endocrine  Serology  Results Review (optional):23779}   Objective    There were no vitals taken for this visit. {Show previous vital signs (optional):23777}  Physical Exam  Depression Screen     No data to display         No results found for any visits on 03/19/23.  Assessment & Plan     *** Encounter to establish care Welcomed to our clinic Reviewed past medical hx, social hx, family hx and surgical hx Pt advised to send all vaccination records or screening   No follow-ups on file.    The patient was advised to call  back or seek an in-person evaluation if the symptoms worsen or if the condition fails to improve as anticipated.  I discussed the assessment and treatment plan with the patient. The patient was provided an opportunity to ask questions and all were answered. The patient agreed with the plan and demonstrated an understanding of the instructions.  I, Debera Lat, PA-C have reviewed all documentation for this visit. The documentation on  03/19/23 for the exam, diagnosis, procedures, and orders are all accurate and complete.  Debera Lat, [facility], MMS [facility] [phone] (phone) [phone] (fax)  [facility]

## 2023-03-19 ENCOUNTER — Encounter: Payer: Self-pay | Admitting: Physician Assistant

## 2023-03-19 ENCOUNTER — Ambulatory Visit (INDEPENDENT_AMBULATORY_CARE_PROVIDER_SITE_OTHER): Payer: BC Managed Care – PPO | Admitting: Physician Assistant

## 2023-03-19 VITALS — BP 103/66 | HR 91 | Temp 97.8°F | Resp 12 | Ht 73.0 in | Wt 150.4 lb

## 2023-03-19 DIAGNOSIS — R590 Localized enlarged lymph nodes: Secondary | ICD-10-CM | POA: Insufficient documentation

## 2023-03-19 DIAGNOSIS — Z136 Encounter for screening for cardiovascular disorders: Secondary | ICD-10-CM

## 2023-03-19 DIAGNOSIS — Z833 Family history of diabetes mellitus: Secondary | ICD-10-CM

## 2023-03-19 DIAGNOSIS — J302 Other seasonal allergic rhinitis: Secondary | ICD-10-CM

## 2023-03-19 DIAGNOSIS — Z7689 Persons encountering health services in other specified circumstances: Secondary | ICD-10-CM | POA: Diagnosis not present

## 2023-03-19 DIAGNOSIS — F1290 Cannabis use, unspecified, uncomplicated: Secondary | ICD-10-CM | POA: Insufficient documentation

## 2023-03-19 DIAGNOSIS — F17200 Nicotine dependence, unspecified, uncomplicated: Secondary | ICD-10-CM

## 2023-03-19 DIAGNOSIS — M5136 Other intervertebral disc degeneration, lumbar region: Secondary | ICD-10-CM

## 2023-03-19 DIAGNOSIS — Z125 Encounter for screening for malignant neoplasm of prostate: Secondary | ICD-10-CM

## 2023-03-19 DIAGNOSIS — Z114 Encounter for screening for human immunodeficiency virus [HIV]: Secondary | ICD-10-CM

## 2023-03-19 DIAGNOSIS — Z809 Family history of malignant neoplasm, unspecified: Secondary | ICD-10-CM

## 2023-03-19 DIAGNOSIS — Z1159 Encounter for screening for other viral diseases: Secondary | ICD-10-CM | POA: Diagnosis not present

## 2023-03-19 DIAGNOSIS — R252 Cramp and spasm: Secondary | ICD-10-CM

## 2023-03-19 DIAGNOSIS — Z1211 Encounter for screening for malignant neoplasm of colon: Secondary | ICD-10-CM

## 2023-03-19 DIAGNOSIS — G44209 Tension-type headache, unspecified, not intractable: Secondary | ICD-10-CM

## 2023-03-19 DIAGNOSIS — Z23 Encounter for immunization: Secondary | ICD-10-CM

## 2023-03-19 DIAGNOSIS — Z0000 Encounter for general adult medical examination without abnormal findings: Secondary | ICD-10-CM

## 2023-03-19 MED ORDER — TETANUS-DIPHTH-ACELL PERTUSSIS 5-2.5-18.5 LF-MCG/0.5 IM SUSY: 0.5000 mL | PREFILLED_SYRINGE | Freq: Once | INTRAMUSCULAR | 0 refills | Status: AC

## 2023-03-20 LAB — COMPREHENSIVE METABOLIC PANEL
ALT: 15 IU/L (ref 0–44)
AST: 21 IU/L (ref 0–40)
Albumin/Globulin Ratio: 1.8
Albumin: 4.4 g/dL (ref 3.8–4.9)
Alkaline Phosphatase: 71 IU/L (ref 44–121)
BUN/Creatinine Ratio: 9 (ref 9–20)
BUN: 10 mg/dL (ref 6–24)
Bilirubin Total: 0.3 mg/dL (ref 0.0–1.2)
CO2: 22 mmol/L (ref 20–29)
Calcium: 9.2 mg/dL (ref 8.7–10.2)
Chloride: 104 mmol/L (ref 96–106)
Creatinine, Ser: 1.09 mg/dL (ref 0.76–1.27)
Globulin, Total: 2.5 g/dL (ref 1.5–4.5)
Glucose: 93 mg/dL (ref 70–99)
Potassium: 4.2 mmol/L (ref 3.5–5.2)
Sodium: 138 mmol/L (ref 134–144)
Total Protein: 6.9 g/dL (ref 6.0–8.5)
eGFR: 81 mL/min/{1.73_m2} (ref >59)

## 2023-03-20 LAB — CBC WITH DIFFERENTIAL/PLATELET
Basophils Absolute: 0.1 10*3/uL (ref 0.0–0.2)
Basos: 1 %
EOS (ABSOLUTE): 0.1 10*3/uL (ref 0.0–0.4)
Eos: 1 %
Hematocrit: 37.9 % (ref 37.5–51.0)
Hemoglobin: 12.9 g/dL — ABNORMAL LOW (ref 13.0–17.7)
Immature Grans (Abs): 0 10*3/uL (ref 0.0–0.1)
Immature Granulocytes: 0 %
Lymphocytes Absolute: 2.5 10*3/uL (ref 0.7–3.1)
Lymphs: 36 %
MCH: 27.5 pg (ref 26.6–33.0)
MCHC: 34 g/dL (ref 31.5–35.7)
MCV: 81 fL (ref 79–97)
Monocytes Absolute: 0.5 10*3/uL (ref 0.1–0.9)
Monocytes: 7 %
Neutrophils Absolute: 3.8 10*3/uL (ref 1.4–7.0)
Neutrophils: 55 %
Platelets: 163 10*3/uL (ref 150–450)
RBC: 4.69 x10E6/uL (ref 4.14–5.80)
RDW: 14.8 % (ref 11.6–15.4)
WBC: 7 10*3/uL (ref 3.4–10.8)

## 2023-03-20 LAB — HEMOGLOBIN A1C
Est. average glucose Bld gHb Est-mCnc: 128 mg/dL
Hgb A1c MFr Bld: 6.1 % — ABNORMAL HIGH (ref 4.8–5.6)

## 2023-03-20 LAB — HIV ANTIBODY (ROUTINE TESTING W REFLEX): HIV Screen 4th Generation wRfx: NONREACTIVE

## 2023-03-20 LAB — LIPID PANEL
Chol/HDL Ratio: 3.6 ratio (ref 0.0–5.0)
Cholesterol, Total: 141 mg/dL (ref 100–199)
HDL: 39 mg/dL — ABNORMAL LOW (ref >39)
LDL Chol Calc (NIH): 84 mg/dL (ref 0–99)
Triglycerides: 93 mg/dL (ref 0–149)
VLDL Cholesterol Cal: 18 mg/dL (ref 5–40)

## 2023-03-20 LAB — PSA TOTAL (REFLEX TO FREE): Prostate Specific Ag, Serum: 0.3 ng/mL (ref 0.0–4.0)

## 2023-03-20 LAB — TSH: TSH: 2.16 u[IU]/mL (ref 0.450–4.500)

## 2023-03-20 LAB — HEPATITIS C ANTIBODY: Hep C Virus Ab: NONREACTIVE

## 2023-03-20 NOTE — Progress Notes (Signed)
Please, let pt know that his lab results are Wnl except A slight decrease HDL/good cholesterol = 39 and a slight decrease in hemoglobin. Advised low cholesterol diet, daily exercise,  stop drinking alcohol. A1C was 6.1, in prediabetic range. Recommended a strict low carb diet. Will check an extra additional labs in 3-6 mo.

## 2023-03-26 ENCOUNTER — Ambulatory Visit
Admission: RE | Admit: 2023-03-26 | Discharge: 2023-03-26 | Disposition: A | Discharge status: Home / Self Care | Payer: BC Managed Care – PPO | Source: Ambulatory Visit | Attending: Physician Assistant | Admitting: Physician Assistant

## 2023-03-26 DIAGNOSIS — Z809 Family history of malignant neoplasm, unspecified: Secondary | ICD-10-CM | POA: Insufficient documentation

## 2023-03-26 DIAGNOSIS — J302 Other seasonal allergic rhinitis: Secondary | ICD-10-CM | POA: Diagnosis not present

## 2023-03-26 DIAGNOSIS — R590 Localized enlarged lymph nodes: Secondary | ICD-10-CM | POA: Diagnosis not present

## 2023-03-29 ENCOUNTER — Encounter: Payer: Self-pay | Admitting: *Deleted

## 2023-05-21 DIAGNOSIS — H52223 Regular astigmatism, bilateral: Secondary | ICD-10-CM | POA: Diagnosis not present

## 2023-05-21 DIAGNOSIS — H04123 Dry eye syndrome of bilateral lacrimal glands: Secondary | ICD-10-CM | POA: Diagnosis not present

## 2023-05-21 DIAGNOSIS — H401131 Primary open-angle glaucoma, bilateral, mild stage: Secondary | ICD-10-CM | POA: Diagnosis not present

## 2023-05-21 DIAGNOSIS — H5203 Hypermetropia, bilateral: Secondary | ICD-10-CM | POA: Diagnosis not present

## 2023-06-18 ENCOUNTER — Ambulatory Visit: Payer: BC Managed Care – PPO | Admitting: Physician Assistant

## 2023-06-18 NOTE — Progress Notes (Deleted)
Established patient visit  Patient: Chad Jenkins   DOB: 10/13/1965   57 y.o. Male  MRN: [id number] Visit Date: 06/18/2023  Today's healthcare provider: Debera Lat, PA-C   No chief complaint on file.  Subjective    HPI  *** Discussed the use of AI scribe software for clinical note transcription with the patient, who gave verbal consent to proceed.  History of Present Illness               03/19/2023    2:15 PM  Depression screen PHQ 2/9  Decreased Interest 0  Down, Depressed, Hopeless 0  PHQ - 2 Score 0  Altered sleeping 0  Tired, decreased energy 0  Change in appetite 0  Feeling bad or failure about yourself  0  Trouble concentrating 0  Moving slowly or fidgety/restless 0  Suicidal thoughts 0  PHQ-9 Score 0  Difficult doing work/chores Not difficult at all       No data to display          Medications: Outpatient Medications Prior to Visit  Medication Sig   gabapentin (NEURONTIN) 300 MG capsule Take 1 capsule (300 mg total) by mouth 3 (three) times daily. (Patient taking differently: Take 600 mg by mouth at bedtime.)   tiZANidine (ZANAFLEX) 4 MG tablet Take 1 tablet by mouth every 8 (eight) hours.   No facility-administered medications prior to visit.    Review of Systems Except see HPI   {Insert previous labs (optional):23779} {See past labs  Heme  Chem  Endocrine  Serology  Results Review (optional):1}   Objective    There were no vitals taken for this visit. {Insert last BP/Wt (optional):23777}{See vitals history (optional):1}   Physical Exam   No results found for any visits on 06/18/23.  Assessment & Plan    *** Assessment and Plan              No follow-ups on file.      [facility]

## 2023-06-25 ENCOUNTER — Encounter: Payer: Self-pay | Admitting: Physician Assistant

## 2023-06-25 ENCOUNTER — Ambulatory Visit (INDEPENDENT_AMBULATORY_CARE_PROVIDER_SITE_OTHER): Payer: BC Managed Care – PPO | Admitting: Physician Assistant

## 2023-06-25 VITALS — BP 108/66 | HR 58 | Ht 73.5 in | Wt 151.1 lb

## 2023-06-25 DIAGNOSIS — R252 Cramp and spasm: Secondary | ICD-10-CM

## 2023-06-25 DIAGNOSIS — R350 Frequency of micturition: Secondary | ICD-10-CM

## 2023-06-25 DIAGNOSIS — K625 Hemorrhage of anus and rectum: Secondary | ICD-10-CM

## 2023-06-25 DIAGNOSIS — R7303 Prediabetes: Secondary | ICD-10-CM | POA: Diagnosis not present

## 2023-06-25 DIAGNOSIS — F17200 Nicotine dependence, unspecified, uncomplicated: Secondary | ICD-10-CM

## 2023-06-25 DIAGNOSIS — Z1211 Encounter for screening for malignant neoplasm of colon: Secondary | ICD-10-CM | POA: Diagnosis not present

## 2023-06-25 NOTE — Progress Notes (Unsigned)
Established patient visit  Patient: Chad Jenkins   DOB: [birth year]   57 y.o. Male  MRN: [id number] Visit Date: 06/25/2023  Today's healthcare provider: Debera Lat, PA-C   Chief Complaint  Patient presents with   Medical Management of Chronic Issues    Would like to discuss options for colonoscopy, cramps are still evident in bilateral legs nightly, has been happening 2-3 times at night, massages and walking help with cramps but still come back    Subjective      Discussed the use of AI scribe software for clinical note transcription with the patient, who gave verbal consent to proceed.  History of Present Illness   The patient, a 57 year old individual with a history of smoking and cannabis use, presents with concerns about not having had a colonoscopy yet. They report no symptoms of nausea, vomiting, diarrhea, constipation, or abdominal pain. However, they did notice blood in their stool about a month ago, which has increased their anxiety about needing a colonoscopy. The patient denies having hemorrhoids but admits to occasional constipation.  In addition to the concern about the colonoscopy, the patient also reports experiencing leg cramps at night. These cramps occur two to three times a night and require the patient to get out of bed and walk around to alleviate the discomfort. The patient notes that these cramps usually occur around 3 or 4 o'clock in the morning.  The patient also reports an increase in urinary frequency. They deny any pain or abnormal color in their urine. 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 They do not engage in any additional exercise outside of their job.           03/19/2023    2:15 PM  Depression screen PHQ 2/9  Decreased Interest 0  Down, Depressed, Hopeless 0  PHQ - 2 Score 0  Altered sleeping 0  Tired, decreased energy 0  Change in appetite 0  Feeling bad or failure about yourself  0   Trouble concentrating 0  Moving slowly or fidgety/restless 0  Suicidal thoughts 0  PHQ-9 Score 0  Difficult doing work/chores Not difficult at all       No data to display          Medications: Outpatient Medications Prior to Visit  Medication Sig   gabapentin (NEURONTIN) 300 MG capsule Take 1 capsule (300 mg total) by mouth 3 (three) times daily. (Patient taking differently: Take 600 mg by mouth at bedtime.)   tiZANidine (ZANAFLEX) 4 MG tablet Take 1 tablet by mouth every 8 (eight) hours.   No facility-administered medications prior to visit.    Review of Systems  All other systems reviewed and are negative.  Except see HPI   {Insert previous labs (optional):23779} {See past labs  Heme  Chem  Endocrine  Serology  Results Review (optional):1}   Objective    BP 108/66 (BP Location: Left Arm, Patient Position: Sitting, Cuff Size: Large)   Pulse (!) 58   Ht 6' 1.5" (1.867 m)   Wt 151 lb 1.6 oz (68.5 kg)   SpO2 99%   BMI 19.66 kg/m  {Insert last BP/Wt (optional):23777}{See vitals history (optional):1}   Physical Exam Vitals reviewed.  Constitutional:      General: He is not in acute distress.    Appearance: Normal appearance. He is not diaphoretic.  HENT:     Head: Normocephalic and atraumatic.  Eyes:     General: No scleral icterus.  Conjunctiva/sclera: Conjunctivae normal.  Cardiovascular:     Rate and Rhythm: Normal rate and regular rhythm.     Pulses: Normal pulses.     Heart sounds: Normal heart sounds. No murmur heard. Pulmonary:     Effort: Pulmonary effort is normal. No respiratory distress.     Breath sounds: Normal breath sounds. No wheezing or rhonchi.  Musculoskeletal:     Cervical back: Neck supple.     Right lower leg: No edema.     Left lower leg: No edema.  Lymphadenopathy:     Cervical: No cervical adenopathy.  Skin:    General: Skin is warm and dry.     Findings: No rash.  Neurological:     Mental Status: He is alert and  oriented to person, place, and time. Mental status is at baseline.  Psychiatric:        Mood and Affect: Mood normal.        Behavior: Behavior normal.     No results found for any visits on 06/25/23.  Assessment & Plan     Rectal Bleeding Noted blood in stool approximately one month ago. No recurrent episodes. No history of hemorrhoids. Constipation present. Family history of colon cancer in uncle and grandmother. Colon cancer screening -Refer to gastroenterology for colonoscopy - Ambulatory referral to gastroenterology for colonoscopy  Urinary frequency Unclear chronicity - POCT urinalysis dipstick - Urine Culture Will reassess after  receiving lab results  Leg cramping Reports nightly leg cramps, possibly related to physically demanding job. No redness or swelling noted.  Possibly due to varicose veins. CBC showed low hemoglobin/anemia  CMP was WNL/6.10.24 -Advise patient to stretch before work, deep tissue massage, improved footwear, and hydration. -Recommend over-the-counter magnesium 1-2 tablets daily. Vit B complex, vit E. -Consider use of compression socks for varicose veins. Will revisit at his next fu   Prediabetes chronic A1C of 6.1. No current medication management. -Advise patient to follow a diet low in carbohydrates and sugars as recommended by the American Diabetic Association. -Check A1C in 3 months. Might start taking Metformin. The 10-year ASCVD risk score (Arnett DK, et al., 2019) is: 8.2% Advised to start taking statin?  Smoking Tobacco Use Advised cessation Reports smoking 2 cigars daily. -Advise patient to check CDC site for resources on quitting smoking.  Cannabis Use Reports using cannabis twice daily. -Documented in chart for consideration in future symptom management.    .General Health Maintenance -Declined tetanus, shingles, and flu vaccines. -Check urine sample for possible urinary frequency. -Return for follow-up in 3 months.       Return in about 3 months (around 09/24/2023) for chronic disease f/u.     The patient was advised to call back or seek an in-person evaluation if the symptoms worsen or if the condition fails to improve as anticipated.  I discussed the assessment and treatment plan with the patient. The patient was provided an opportunity to ask questions and all were answered. The patient agreed with the plan and demonstrated an understanding of the instructions.  I, Debera Lat, PA-C have reviewed all documentation for this visit. The documentation on  06/25/23  for the exam, diagnosis, procedures, and orders are all accurate and complete.  Debera Lat, [facility], MMS [facility] [phone] (phone) [phone] (fax)  [facility]

## 2023-07-06 ENCOUNTER — Encounter: Payer: Self-pay | Admitting: *Deleted

## 2023-08-20 DIAGNOSIS — H5203 Hypermetropia, bilateral: Secondary | ICD-10-CM | POA: Diagnosis not present

## 2023-08-20 DIAGNOSIS — H401131 Primary open-angle glaucoma, bilateral, mild stage: Secondary | ICD-10-CM | POA: Diagnosis not present

## 2023-08-20 DIAGNOSIS — H04123 Dry eye syndrome of bilateral lacrimal glands: Secondary | ICD-10-CM | POA: Diagnosis not present

## 2023-08-20 DIAGNOSIS — H52223 Regular astigmatism, bilateral: Secondary | ICD-10-CM | POA: Diagnosis not present

## 2023-09-24 ENCOUNTER — Ambulatory Visit: Payer: BC Managed Care – PPO | Admitting: Physician Assistant

## 2023-09-24 NOTE — Progress Notes (Unsigned)
Established patient visit  Patient: Chad Jenkins   DOB: 05-19-1966   57 y.o. Male  MRN: [id number] Visit Date: 09/24/2023  Today's healthcare provider: Debera Lat, PA-C   No chief complaint on file.  Subjective       Discussed the use of AI scribe software for clinical note transcription with the patient, who gave verbal consent to proceed.  History of Present Illness               03/19/2023    2:15 PM  Depression screen PHQ 2/9  Decreased Interest 0  Down, Depressed, Hopeless 0  PHQ - 2 Score 0  Altered sleeping 0  Tired, decreased energy 0  Change in appetite 0  Feeling bad or failure about yourself  0  Trouble concentrating 0  Moving slowly or fidgety/restless 0  Suicidal thoughts 0  PHQ-9 Score 0  Difficult doing work/chores Not difficult at all       No data to display          Medications: Outpatient Medications Prior to Visit  Medication Sig  . gabapentin (NEURONTIN) 300 MG capsule Take 1 capsule (300 mg total) by mouth 3 (three) times daily. (Patient taking differently: Take 600 mg by mouth at bedtime.)  . tiZANidine (ZANAFLEX) 4 MG tablet Take 1 tablet by mouth every 8 (eight) hours.   No facility-administered medications prior to visit.    Review of Systems  All other systems reviewed and are negative. All negative Except see HPI   {Insert previous labs (optional):23779} {See past labs  Heme  Chem  Endocrine  Serology  Results Review (optional):1}   Objective    There were no vitals taken for this visit. {Insert last BP/Wt (optional):23777}{See vitals history (optional):1}   Physical Exam Vitals reviewed.  Constitutional:      General: He is not in acute distress.    Appearance: Normal appearance. He is not diaphoretic.  HENT:     Head: Normocephalic and atraumatic.  Eyes:     General: No scleral icterus.    Conjunctiva/sclera: Conjunctivae normal.  Cardiovascular:     Rate and Rhythm: Normal rate and regular rhythm.      Pulses: Normal pulses.     Heart sounds: Normal heart sounds. No murmur heard. Pulmonary:     Effort: Pulmonary effort is normal. No respiratory distress.     Breath sounds: Normal breath sounds. No wheezing or rhonchi.  Musculoskeletal:     Cervical back: Neck supple.     Right lower leg: No edema.     Left lower leg: No edema.  Lymphadenopathy:     Cervical: No cervical adenopathy.  Skin:    General: Skin is warm and dry.     Findings: No rash.  Neurological:     Mental Status: He is alert and oriented to person, place, and time. Mental status is at baseline.  Psychiatric:        Mood and Affect: Mood normal.        Behavior: Behavior normal.     No results found for any visits on 09/24/23.      Assessment and Plan             No orders of the defined types were placed in this encounter.   No follow-ups on file.   The patient was advised to call back or seek an in-person evaluation if the symptoms worsen or if the condition fails to improve as anticipated.  I discussed the  assessment and treatment plan with the patient. The patient was provided an opportunity to ask questions and all were answered. The patient agreed with the plan and demonstrated an understanding of the instructions.  I, Debera Lat, PA-C have reviewed all documentation for this visit. The documentation on 09/24/2023  for the exam, diagnosis, procedures, and orders are all accurate and complete.  Debera Lat, [facility], MMS [facility] [phone] (phone) [phone] (fax)  [facility]

## 2023-11-20 DIAGNOSIS — H52223 Regular astigmatism, bilateral: Secondary | ICD-10-CM | POA: Diagnosis not present

## 2023-11-20 DIAGNOSIS — H401131 Primary open-angle glaucoma, bilateral, mild stage: Secondary | ICD-10-CM | POA: Diagnosis not present

## 2023-11-20 DIAGNOSIS — H04123 Dry eye syndrome of bilateral lacrimal glands: Secondary | ICD-10-CM | POA: Diagnosis not present

## 2023-11-20 DIAGNOSIS — H5203 Hypermetropia, bilateral: Secondary | ICD-10-CM | POA: Diagnosis not present

## 2024-01-04 ENCOUNTER — Ambulatory Visit: Admitting: Family Medicine

## 2024-01-04 ENCOUNTER — Encounter: Payer: Self-pay | Admitting: Family Medicine

## 2024-01-04 VITALS — BP 109/65 | HR 60 | Temp 97.9°F | Resp 20 | Ht 73.5 in | Wt 150.0 lb

## 2024-01-04 DIAGNOSIS — J302 Other seasonal allergic rhinitis: Secondary | ICD-10-CM | POA: Diagnosis not present

## 2024-01-04 DIAGNOSIS — F17290 Nicotine dependence, other tobacco product, uncomplicated: Secondary | ICD-10-CM | POA: Diagnosis not present

## 2024-01-04 DIAGNOSIS — F17200 Nicotine dependence, unspecified, uncomplicated: Secondary | ICD-10-CM | POA: Insufficient documentation

## 2024-01-04 DIAGNOSIS — M5416 Radiculopathy, lumbar region: Secondary | ICD-10-CM

## 2024-01-04 DIAGNOSIS — Z0000 Encounter for general adult medical examination without abnormal findings: Secondary | ICD-10-CM

## 2024-01-04 DIAGNOSIS — Z1212 Encounter for screening for malignant neoplasm of rectum: Secondary | ICD-10-CM

## 2024-01-04 DIAGNOSIS — Z125 Encounter for screening for malignant neoplasm of prostate: Secondary | ICD-10-CM

## 2024-01-04 DIAGNOSIS — Z1211 Encounter for screening for malignant neoplasm of colon: Secondary | ICD-10-CM

## 2024-01-04 NOTE — Assessment & Plan Note (Signed)
 Setting up a colonoscopy for him.  Never been screened for colon cancer before.  Checking his PSA as well.  No family history of prostate cancer.

## 2024-01-04 NOTE — Assessment & Plan Note (Signed)
 Takes gabapentin 300mg  TID during the day and Gabapentin 600mg  at bedtime.  Gabapentin controls his pain fairly well.

## 2024-01-04 NOTE — Progress Notes (Signed)
 New Patient Office Visit  Subjective    Patient ID: Chad Jenkins, male    DOB: 10/24/1965  Age: 58 y.o. MRN: [id number]  CC:  Chief Complaint  Patient presents with   Leg Cramp    When walking and during sleep   Referral    Would like to have Colonoscoy    HPI Chad Jenkins presents to establish care.  [name] 58 year old man with lumbar radiculopathy (right), muscle cramps in his legs at night and seasonal allergies.  He has never had surgery.  He has never had a colonoscopy or Cologuard.  Reports there is diabetes in his family and hypertension.  Multiple people in his family have died from cancer.  His mother is still living and is healthy. Drinks 2-3 shots of alcohol on the weekends.  Never drinks 6 or more alcoholic beverages in a day.  Smokes cigars, black and mild 1-2 a day. Takes gabapentin 300 mg during the day and gabapentin 600 mg at bedtime.  This controls his radicular pain. He has a prescription for tizanidine but no longer uses this.   He gets nocturnal leg cramps that wake him from sleep.  They are very painful.  He has to getup and walk to relieve the cramps.This does not happen every night but does occur more often than he would like.  He has tried drinking Gatorade but this did not seem to help. 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  His BMI is 19.5.  Outpatient Encounter Medications as of 01/04/2024  Medication Sig   gabapentin (NEURONTIN) 600 MG tablet Take 600 mg by mouth at bedtime.   tiZANidine (ZANAFLEX) 4 MG tablet Take 1 tablet by mouth every 8 (eight) hours.   gabapentin (NEURONTIN) 300 MG capsule Take 1 capsule (300 mg total) by mouth 3 (three) times daily. (Patient taking differently: Take 600 mg by mouth at bedtime.)   No facility-administered encounter medications on file as of 01/04/2024.    Past Medical History:  Diagnosis Date   Bulging lumbar disc     Past Surgical History:   Procedure Laterality Date   NO PAST SURGERIES      Family History  Problem Relation Age of Onset   Healthy Mother    Cancer Father        brain   Healthy Son    Healthy Son    Cancer Maternal Uncle    Cancer Paternal Grandmother    Cancer Paternal Grandfather    Glaucoma Paternal Grandfather     Social History   Socioeconomic History   Marital status: Married    Spouse name: Not on file   Number of children: 2   Years of education: Not on file   Highest education level: GED or equivalent  Occupational History   Not on file  Tobacco Use   Smoking status: Former    Types: Cigars    Quit date: 1997    Years since quitting: 28.2    Passive exposure: Current   Smokeless tobacco: Never  Vaping Use   Vaping status: Never Used  Substance and Sexual Activity   Alcohol use: Yes    Alcohol/week: 4.0 standard drinks of alcohol    Types: 4 Shots of liquor per week   Drug use: Never   Sexual activity: Not on file  Other Topics Concern   Not on file  Social History Narrative  Not on file   Social Drivers of Health   Financial Resource Strain: Low Risk  (06/24/2023)   Overall Financial Resource Strain (CARDIA)    Difficulty of Paying Living Expenses: Not hard at all  Food Insecurity: No Food Insecurity (06/24/2023)   Hunger Vital Sign    Worried About Running Out of Food in the Last Year: Never true    Ran Out of Food in the Last Year: Never true  Transportation Needs: No Transportation Needs (06/24/2023)   PRAPARE - Administrator, Civil Service (Medical): No    Lack of Transportation (Non-Medical): No  Physical Activity: Sufficiently Active (06/24/2023)   Exercise Vital Sign    Days of Exercise per Week: 6 days    Minutes of Exercise per Session: 150+ min  Stress: No Stress Concern Present (06/24/2023)   [employer] of Occupational Health - Occupational Stress Questionnaire    Feeling of Stress : Not at all  Social Connections: Socially Integrated  (06/24/2023)   Social Connection and Isolation Panel [NHANES]    Frequency of Communication with Friends and Family: More than three times a week    Frequency of Social Gatherings with Friends and Family: More than three times a week    Attends Religious Services: 1 to 4 times per year    Active Member of [employer] or Organizations: Yes    Attends Banker Meetings: 1 to 4 times per year    Marital Status: Married  Catering manager Violence: Not on file    ROS      Objective   BP 109/65 (BP Location: Left Arm, Patient Position: Sitting, Cuff Size: Normal)   Pulse 60   Temp 97.9 F (36.6 C) (Oral)   Resp 20   Ht 6' 1.5" (1.867 m)   Wt 150 lb (68 kg)   SpO2 96%   BMI 19.52 kg/m    Physical Exam Vitals and nursing note reviewed.  Constitutional:      Appearance: Normal appearance.  HENT:     Head: Normocephalic and atraumatic.  Eyes:     Conjunctiva/sclera: Conjunctivae normal.  Cardiovascular:     Rate and Rhythm: Normal rate and regular rhythm.  Pulmonary:     Effort: Pulmonary effort is normal.     Breath sounds: Normal breath sounds.  Musculoskeletal:     Right lower leg: No edema.     Left lower leg: No edema.  Skin:    General: Skin is warm and dry.  Neurological:     Mental Status: He is alert and oriented to person, place, and time.  Psychiatric:        Mood and Affect: Mood normal.        Behavior: Behavior normal.        Thought Content: Thought content normal.        Judgment: Judgment normal.            The 10-year ASCVD risk score (Arnett DK, et al., 2019) is: 4.9%     Assessment & Plan:  Screening for prostate cancer -     PSA; Future  Wellness examination Assessment & Plan: Setting up a colonoscopy for him.  Never been screened for colon cancer before.  Checking his PSA as well.  No family history of prostate cancer.  Orders: -     CBC with Differential/Platelet; Future -     Comprehensive metabolic panel with GFR;  Future -     Lipid panel; Future -  TSH + free T4; Future -     PSA; Future  Screening for colorectal cancer -     Amb Referral to Colonoscopy  Lumbar radiculopathy, right Assessment & Plan: Takes gabapentin 300mg  TID during the day and Gabapentin 600mg  at bedtime.  Gabapentin controls his pain fairly well.     Other tobacco product nicotine dependence, uncomplicated Assessment & Plan: Smokes Black and Mild cigars 1-2 a day and inhales them     Seasonal allergies Assessment & Plan: Doing well with his allergies now.       Return in about 4 weeks (around 02/01/2024) for physical exam.   Alease Medina, MD

## 2024-01-04 NOTE — Assessment & Plan Note (Addendum)
 Smokes Black and Mild cigars 1-2 a day and inhales them

## 2024-01-04 NOTE — Assessment & Plan Note (Signed)
 Doing well with his allergies now.

## 2024-01-21 ENCOUNTER — Encounter: Payer: Self-pay | Admitting: *Deleted

## 2024-01-28 ENCOUNTER — Ambulatory Visit (INDEPENDENT_AMBULATORY_CARE_PROVIDER_SITE_OTHER)

## 2024-01-28 DIAGNOSIS — Z125 Encounter for screening for malignant neoplasm of prostate: Secondary | ICD-10-CM

## 2024-01-28 DIAGNOSIS — Z0000 Encounter for general adult medical examination without abnormal findings: Secondary | ICD-10-CM | POA: Diagnosis not present

## 2024-01-28 DIAGNOSIS — D649 Anemia, unspecified: Secondary | ICD-10-CM | POA: Diagnosis not present

## 2024-01-29 ENCOUNTER — Encounter: Payer: Self-pay | Admitting: Family Medicine

## 2024-01-29 LAB — CBC WITH DIFFERENTIAL/PLATELET
Basophils Absolute: 0.1 10*3/uL (ref 0.0–0.2)
Basos: 1 %
EOS (ABSOLUTE): 0.2 10*3/uL (ref 0.0–0.4)
Eos: 3 %
Hematocrit: 39.3 % (ref 37.5–51.0)
Hemoglobin: 12.9 g/dL — ABNORMAL LOW (ref 13.0–17.7)
Immature Grans (Abs): 0 10*3/uL (ref 0.0–0.1)
Immature Granulocytes: 0 %
Lymphocytes Absolute: 2.6 10*3/uL (ref 0.7–3.1)
Lymphs: 40 %
MCH: 27.2 pg (ref 26.6–33.0)
MCHC: 32.8 g/dL (ref 31.5–35.7)
MCV: 83 fL (ref 79–97)
Monocytes Absolute: 0.5 10*3/uL (ref 0.1–0.9)
Monocytes: 8 %
Neutrophils Absolute: 3.1 10*3/uL (ref 1.4–7.0)
Neutrophils: 48 %
Platelets: 117 10*3/uL — ABNORMAL LOW (ref 150–450)
RBC: 4.75 x10E6/uL (ref 4.14–5.80)
RDW: 14.6 % (ref 11.6–15.4)
WBC: 6.6 10*3/uL (ref 3.4–10.8)

## 2024-01-29 LAB — COMPREHENSIVE METABOLIC PANEL WITH GFR
ALT: 9 IU/L (ref 0–44)
AST: 18 IU/L (ref 0–40)
Albumin: 3.9 g/dL (ref 3.8–4.9)
Alkaline Phosphatase: 78 IU/L (ref 44–121)
BUN/Creatinine Ratio: 9 (ref 9–20)
BUN: 10 mg/dL (ref 6–24)
Bilirubin Total: 0.2 mg/dL (ref 0.0–1.2)
CO2: 20 mmol/L (ref 20–29)
Calcium: 8.7 mg/dL (ref 8.7–10.2)
Chloride: 107 mmol/L — ABNORMAL HIGH (ref 96–106)
Creatinine, Ser: 1.11 mg/dL (ref 0.76–1.27)
Globulin, Total: 2.5 g/dL (ref 1.5–4.5)
Glucose: 123 mg/dL — ABNORMAL HIGH (ref 70–99)
Potassium: 4.1 mmol/L (ref 3.5–5.2)
Sodium: 141 mmol/L (ref 134–144)
Total Protein: 6.4 g/dL (ref 6.0–8.5)
eGFR: 78 mL/min/{1.73_m2} (ref >59)

## 2024-01-29 LAB — TSH+FREE T4
Free T4: 1.04 ng/dL (ref 0.82–1.77)
TSH: 2.4 u[IU]/mL (ref 0.450–4.500)

## 2024-01-29 LAB — PSA: Prostate Specific Ag, Serum: 0.4 ng/mL (ref 0.0–4.0)

## 2024-01-29 LAB — LIPID PANEL
Chol/HDL Ratio: 3.5 ratio (ref 0.0–5.0)
Cholesterol, Total: 119 mg/dL (ref 100–199)
HDL: 34 mg/dL — ABNORMAL LOW (ref >39)
LDL Chol Calc (NIH): 70 mg/dL (ref 0–99)
Triglycerides: 76 mg/dL (ref 0–149)
VLDL Cholesterol Cal: 15 mg/dL (ref 5–40)

## 2024-01-30 LAB — PSA: Prostate Specific Ag, Serum: 0.4 ng/mL (ref 0.0–4.0)

## 2024-01-30 LAB — SPECIMEN STATUS REPORT

## 2024-02-04 ENCOUNTER — Encounter: Payer: Self-pay | Admitting: Family Medicine

## 2024-02-04 ENCOUNTER — Ambulatory Visit (INDEPENDENT_AMBULATORY_CARE_PROVIDER_SITE_OTHER): Admitting: Family Medicine

## 2024-02-04 VITALS — BP 105/64 | HR 54 | Temp 97.7°F | Resp 20 | Ht 73.5 in | Wt 151.0 lb

## 2024-02-04 DIAGNOSIS — R7303 Prediabetes: Secondary | ICD-10-CM | POA: Insufficient documentation

## 2024-02-04 DIAGNOSIS — M5416 Radiculopathy, lumbar region: Secondary | ICD-10-CM

## 2024-02-04 DIAGNOSIS — D696 Thrombocytopenia, unspecified: Secondary | ICD-10-CM | POA: Diagnosis not present

## 2024-02-04 DIAGNOSIS — D509 Iron deficiency anemia, unspecified: Secondary | ICD-10-CM

## 2024-02-04 DIAGNOSIS — D649 Anemia, unspecified: Secondary | ICD-10-CM | POA: Insufficient documentation

## 2024-02-04 LAB — POCT GLYCOSYLATED HEMOGLOBIN (HGB A1C): Hemoglobin A1C: 6.1 % — AB (ref 4.0–5.6)

## 2024-02-04 NOTE — Progress Notes (Signed)
 Established Patient Office Visit  Subjective   Patient ID: Chad Jenkins, male    DOB: [birth year]  Age: 58 y.o. MRN: [id number]  Chief Complaint  Patient presents with   Medical Management of Chronic Issues    HPI [name] 57-yo gentleman with lumbar radiculopathy (right), nocturnal muscle cramps in his legs and seasonal allergies. Patient came in to review labs.  01/28/2024 Hemoglobin 12.9, MCV 83,  platelet count 117, Comparison to 03/19/2023 hemoglobin 12.9 hematocrit 37 9, MCV 81 and platelet count 163. He eats hamberger and steak most everyday.  He is not aware that he has ever had  a low platelet count..  Fasting blood glucose 123, total cholesterol 119, trig 76, HDL 34 and LDL 70.   A1c today is 6.1%.  He drinks juice daily.      ROS    Objective:     BP 105/64 (BP Location: Left Arm, Patient Position: Sitting, Cuff Size: Normal)   Pulse (!) 54   Temp 97.7 F (36.5 C) (Oral)   Resp 20   Ht 6' 1.5" (1.867 m)   Wt 151 lb (68.5 kg)   SpO2 97%   BMI 19.65 kg/m    Physical Exam Vitals reviewed.  Constitutional:      Appearance: Normal appearance.  HENT:     Head: Normocephalic.  Eyes:     General:        Right eye: No discharge.        Left eye: No discharge.  Cardiovascular:     Rate and Rhythm: Normal rate.  Pulmonary:     Effort: Pulmonary effort is normal.  Neurological:     Mental Status: He is alert and oriented to person, place, and time.  Psychiatric:        Mood and Affect: Mood normal.        Behavior: Behavior normal.        Thought Content: Thought content normal.        Judgment: Judgment normal.          Results for orders placed or performed in visit on 02/04/24  POCT glycosylated hemoglobin (Hb A1C)  Result Value Ref Range   Hemoglobin A1C 6.1 (A) 4.0 - 5.6 %   HbA1c POC (<> result, manual entry)     HbA1c, POC (prediabetic range)     HbA1c, POC (controlled diabetic range)        The ASCVD Risk score (Arnett DK, et al.,  2019) failed to calculate for the following reasons:   The valid total cholesterol range is 130 to 320 mg/dL    Assessment & Plan:  Prediabetes -     POCT glycosylated hemoglobin (Hb A1C)  Thrombocytopenia (HCC) Assessment & Plan: Getting an ultrasound of his abdomen to check for portal hypertension.  Will recheck his platelet count in a month.    Orders: -     US  ABDOMINAL PELVIC ART/VENT FLOW DOPPLER; Future  Borderline type 2 diabetes mellitus Assessment & Plan: A1c is 6.1% today.  He Drinks a lot of juice.  Discussed reduction of sugars and carbohydrates in his diet.     Lumbar radiculopathy, right Assessment & Plan: Doing well with gabapentin , it helps with the pain.     Iron deficiency anemia, unspecified iron deficiency anemia type Assessment & Plan: Hgb 12.9.  Unchanged in a year.  MCV 83.  Likely iron deficiency.  Will check iron panel on next set of labs next month. Will check B12 and folate  in case this is a mixed anemia.     Orders: -     Iron, TIBC and Ferritin Panel; Future  Anemia, unspecified type Assessment & Plan: Hgb 12.9.  Unchanged in a year.  MCV 83.  Likely iron deficiency.  Will check iron panel on next set of labs next month. Will check B12 and folate in case this is a mixed anemia.     Orders: -     Vitamin B12; Future     Return in about 4 weeks (around 03/03/2024).    Shatia Sindoni K Semone Orlov, MD

## 2024-02-04 NOTE — Assessment & Plan Note (Signed)
 A1c is 6.1% today.  He Drinks a lot of juice.  Discussed reduction of sugars and carbohydrates in his diet.

## 2024-02-04 NOTE — Assessment & Plan Note (Signed)
 Getting an ultrasound of his abdomen to check for portal hypertension.  Will recheck his platelet count in a month.

## 2024-02-04 NOTE — Assessment & Plan Note (Addendum)
 Hgb 12.9.  Unchanged in a year.  MCV 83.  Likely iron deficiency.  Will check iron panel on next set of labs next month. Will check B12 and folate in case this is a mixed anemia.

## 2024-02-04 NOTE — Assessment & Plan Note (Signed)
 Doing well with gabapentin , it helps with the pain.

## 2024-02-12 ENCOUNTER — Ambulatory Visit
Admission: RE | Admit: 2024-02-12 | Discharge: 2024-02-12 | Disposition: A | Discharge status: Home / Self Care | Source: Ambulatory Visit | Attending: Family Medicine | Admitting: Family Medicine

## 2024-02-12 DIAGNOSIS — D696 Thrombocytopenia, unspecified: Secondary | ICD-10-CM | POA: Diagnosis not present

## 2024-02-18 ENCOUNTER — Encounter: Payer: Self-pay | Admitting: *Deleted

## 2024-03-04 ENCOUNTER — Encounter: Payer: Self-pay | Admitting: Family Medicine

## 2024-03-04 ENCOUNTER — Ambulatory Visit (INDEPENDENT_AMBULATORY_CARE_PROVIDER_SITE_OTHER): Admitting: Family Medicine

## 2024-03-04 VITALS — BP 99/62 | HR 51 | Temp 98.1°F | Resp 18 | Ht 73.5 in | Wt 151.0 lb

## 2024-03-04 DIAGNOSIS — R718 Other abnormality of red blood cells: Secondary | ICD-10-CM | POA: Insufficient documentation

## 2024-03-04 DIAGNOSIS — D696 Thrombocytopenia, unspecified: Secondary | ICD-10-CM | POA: Diagnosis not present

## 2024-03-04 DIAGNOSIS — D509 Iron deficiency anemia, unspecified: Secondary | ICD-10-CM

## 2024-03-04 LAB — CBC
Hematocrit: 39.4 % (ref 37.5–51.0)
Hemoglobin: 13 g/dL (ref 13.0–17.7)
MCH: 27.4 pg (ref 26.6–33.0)
MCHC: 33 g/dL (ref 31.5–35.7)
MCV: 83 fL (ref 79–97)
Platelets: 129 10*3/uL — ABNORMAL LOW (ref 150–450)
RBC: 4.74 x10E6/uL (ref 4.14–5.80)
RDW: 15 % (ref 11.6–15.4)
WBC: 6.6 10*3/uL (ref 3.4–10.8)

## 2024-03-04 MED ORDER — GABAPENTIN 600 MG PO TABS: 600.0000 mg | ORAL_TABLET | Freq: Every evening | ORAL | 1 refills | Status: DC

## 2024-03-04 NOTE — Assessment & Plan Note (Signed)
 Ultrasound of the liver did not demonstrate portal hypertension.  01/28/2024 platelet count was 117.  Checking platelet count today.  If still thrombocytopenic will refer to hematology.

## 2024-03-04 NOTE — Assessment & Plan Note (Signed)
 01/28/2024 MCV 83.  Checking iron indices B12 and folate as he may have a mixed pattern.

## 2024-03-04 NOTE — Progress Notes (Signed)
   Established Patient Office Visit  Subjective   Patient ID: Chad Jenkins, male    DOB: January 29, 1966  Age: 58 y.o. MRN: [id number]  No chief complaint on file.   HPI 58 year old gentleman with borderline diabetes (02/04/2024 A1c 6.1%), lumbar radiculopathy (right), seasonal allergies and thrombocytopenia, microcytosis with MCV 83. He is here to check on his microcytosis and thrombocytopenia.  01/28/2024 platelet count was 117.  02/12/2024 he had duplex ultrasound of the liver to evaluate for portal hypertension.  No evidence of portal hypertension. He feels fine and has no problems.    ROS    Objective:     BP 99/62 (BP Location: Left Arm, Patient Position: Sitting, Cuff Size: Normal)   Pulse (!) 51   Temp 98.1 F (36.7 C) (Oral)   Resp 18   Ht 6' 1.5" (1.867 m)   Wt 151 lb (68.5 kg)   SpO2 96%   BMI 19.65 kg/m    Physical Exam Vitals and nursing note reviewed.  Constitutional:      Appearance: Normal appearance.  HENT:     Head: Normocephalic and atraumatic.  Eyes:     Conjunctiva/sclera: Conjunctivae normal.  Cardiovascular:     Rate and Rhythm: Normal rate and regular rhythm.  Pulmonary:     Effort: Pulmonary effort is normal.     Breath sounds: Normal breath sounds.  Musculoskeletal:     Right lower leg: No edema.     Left lower leg: No edema.  Skin:    General: Skin is warm and dry.  Neurological:     Mental Status: He is alert and oriented to person, place, and time.  Psychiatric:        Mood and Affect: Mood normal.        Behavior: Behavior normal.        Thought Content: Thought content normal.        Judgment: Judgment normal.          No results found for any visits on 03/04/24.    The ASCVD Risk score (Arnett DK, et al., 2019) failed to calculate for the following reasons:   The valid total cholesterol range is 130 to 320 mg/dL    Assessment & Plan:  Iron deficiency anemia, unspecified iron deficiency anemia type -     Gabapentin ; Take  1 tablet (600 mg total) by mouth at bedtime.  Dispense: 90 tablet; Refill: 1 -     CBC -     Iron, TIBC and Ferritin Panel  Thrombocytopenia (HCC) Assessment & Plan: Ultrasound of the liver did not demonstrate portal hypertension.  01/28/2024 platelet count was 117.  Checking platelet count today.  If still thrombocytopenic will refer to hematology.  Orders: -     CBC  Microcytosis Assessment & Plan: 01/28/2024 MCV 83.  Checking iron indices B12 and folate as he may have a mixed pattern.      Return in about 3 months (around 06/04/2024).    Amaura Authier K Aslan Himes, MD

## 2024-03-05 ENCOUNTER — Other Ambulatory Visit: Payer: Self-pay | Admitting: Family Medicine

## 2024-03-05 ENCOUNTER — Ambulatory Visit: Payer: Self-pay | Admitting: Family Medicine

## 2024-03-05 DIAGNOSIS — D696 Thrombocytopenia, unspecified: Secondary | ICD-10-CM

## 2024-03-05 LAB — IRON,TIBC AND FERRITIN PANEL
Ferritin: 207 ng/mL (ref 30–400)
Iron Saturation: 50 % (ref 15–55)
Iron: 112 ug/dL (ref 38–169)
Total Iron Binding Capacity: 222 ug/dL — ABNORMAL LOW (ref 250–450)
UIBC: 110 ug/dL — ABNORMAL LOW (ref 111–343)

## 2024-03-14 ENCOUNTER — Encounter: Payer: Self-pay | Admitting: Oncology

## 2024-03-14 ENCOUNTER — Inpatient Hospital Stay

## 2024-03-14 ENCOUNTER — Inpatient Hospital Stay: Attending: Oncology | Admitting: Oncology

## 2024-03-14 VITALS — BP 103/67 | HR 54 | Temp 97.9°F | Resp 20 | Ht 73.5 in | Wt 150.0 lb

## 2024-03-14 DIAGNOSIS — D696 Thrombocytopenia, unspecified: Secondary | ICD-10-CM

## 2024-03-14 DIAGNOSIS — Z87891 Personal history of nicotine dependence: Secondary | ICD-10-CM | POA: Diagnosis not present

## 2024-03-14 DIAGNOSIS — Z808 Family history of malignant neoplasm of other organs or systems: Secondary | ICD-10-CM | POA: Diagnosis not present

## 2024-03-14 LAB — CBC (CANCER CENTER ONLY)
HCT: 34.5 % — ABNORMAL LOW (ref 39.0–52.0)
Hemoglobin: 12.2 g/dL — ABNORMAL LOW (ref 13.0–17.0)
MCH: 26.9 pg (ref 26.0–34.0)
MCHC: 35.4 g/dL (ref 30.0–36.0)
MCV: 76.2 fL — ABNORMAL LOW (ref 80.0–100.0)
Platelet Count: 145 10*3/uL — ABNORMAL LOW (ref 150–400)
RBC: 4.53 MIL/uL (ref 4.22–5.81)
RDW: 14.6 % (ref 11.5–15.5)
WBC Count: 5.7 10*3/uL (ref 4.0–10.5)
nRBC: 0 % (ref 0.0–0.2)

## 2024-03-14 LAB — IRON AND TIBC
Iron: 106 ug/dL (ref 45–182)
Saturation Ratios: 40 % — ABNORMAL HIGH (ref 17.9–39.5)
TIBC: 267 ug/dL (ref 250–450)
UIBC: 161 ug/dL

## 2024-03-14 LAB — FOLATE: Folate: 10.2 ng/mL (ref >5.9)

## 2024-03-14 LAB — VITAMIN B12: Vitamin B-12: 210 pg/mL (ref 180–914)

## 2024-03-14 LAB — FERRITIN: Ferritin: 103 ng/mL (ref 24–336)

## 2024-03-14 NOTE — Progress Notes (Signed)
 [facility]  [phone]) [phone] Fax:(336) [phone]  ID: Lorelie Rohrer OB: 05/01/1966  MR#: [id number]  AOZ#:[id number]  Patient Care Team: Ziglar, Susan K, MD as PCP - General (Family Medicine) Shellie Dials, MD as Consulting Physician (Oncology)  CHIEF COMPLAINT: Thrombocytopenia.  INTERVAL HISTORY: Patient is a 58 year old male who was noted to have a mildly decreased platelet count on routine blood work.  Repeat CBC confirmed the results.  He currently feels well and is asymptomatic.  He has no neurologic complaints.  He denies any recent fevers or illnesses.  He has a good appetite and denies weight loss.  He has no new medications.  He has no chest pain, shortness of breath, cough, or hemoptysis.  He denies any nausea, vomiting, constipation, or diarrhea.  He has no urinary complaints.  Patient feels like his baseline and offers no specific complaints today.  REVIEW OF SYSTEMS:   Review of Systems  Constitutional: Negative.  Negative for fever, malaise/fatigue and weight loss.  Respiratory: Negative.  Negative for cough, hemoptysis and shortness of breath.   Cardiovascular: Negative.  Negative for chest pain and leg swelling.  Gastrointestinal: Negative.  Negative for abdominal pain.  Genitourinary: Negative.  Negative for dysuria.  Musculoskeletal: Negative.  Negative for back pain.  Skin: Negative.  Negative for rash.  Neurological: Negative.  Negative for dizziness, focal weakness, weakness and headaches.  Psychiatric/Behavioral: Negative.  The patient is not nervous/anxious.     As per HPI. Otherwise, a complete review of systems is negative.  PAST MEDICAL HISTORY: Past Medical History:  Diagnosis Date   Bulging lumbar disc     PAST SURGICAL HISTORY: Past Surgical History:  Procedure Laterality Date   NO PAST SURGERIES      FAMILY HISTORY: Family History  Problem Relation Age of Onset   Healthy Mother    Cancer Father        brain    Healthy Son    Healthy Son    Cancer Maternal Uncle    Cancer Paternal Grandmother    Cancer Paternal Grandfather    Glaucoma Paternal Grandfather     ADVANCED DIRECTIVES (Y/N):  N  HEALTH MAINTENANCE: Social History   Tobacco Use   Smoking status: Former    Types: Cigars    Quit date: 1997    Years since quitting: 28.4    Passive exposure: Current   Smokeless tobacco: Never  Vaping Use   Vaping status: Never Used  Substance Use Topics   Alcohol use: Yes    Alcohol/week: 4.0 standard drinks of alcohol    Types: 4 Shots of liquor per week   Drug use: Yes    Types: Marijuana     Colonoscopy:  PAP:  Bone density:  Lipid panel:  No Known Allergies  Current Outpatient Medications  Medication Sig Dispense Refill   gabapentin  (NEURONTIN ) 600 MG tablet Take 1 tablet (600 mg total) by mouth at bedtime. 90 tablet 1   tiZANidine  (ZANAFLEX ) 4 MG tablet Take 1 tablet by mouth every 8 (eight) hours.     No current facility-administered medications for this visit.    OBJECTIVE: Vitals:   03/14/24 1129  BP: 103/67  Pulse: (!) 54  Resp: 20  Temp: 97.9 F (36.6 C)  SpO2: 100%     Body mass index is 19.52 kg/m.    ECOG FS:0 - Asymptomatic  General: Well-developed, well-nourished, no acute distress. Eyes: Pink conjunctiva, anicteric sclera. HEENT: Normocephalic, moist mucous membranes. Lungs: No audible wheezing  or coughing. Heart: Regular rate and rhythm. Abdomen: Soft, nontender, no obvious distention. Musculoskeletal: No edema, cyanosis, or clubbing. Neuro: Alert, answering all questions appropriately. Cranial nerves grossly intact. Skin: No rashes or petechiae noted. Psych: Normal affect. Lymphatics: No cervical, calvicular, axillary or inguinal LAD.   LAB RESULTS:  Lab Results  Component Value Date   NA 141 01/28/2024   K 4.1 01/28/2024   CL 107 (H) 01/28/2024   CO2 20 01/28/2024   GLUCOSE 123 (H) 01/28/2024   BUN 10 01/28/2024   CREATININE 1.11  01/28/2024   CALCIUM 8.7 01/28/2024   PROT 6.4 01/28/2024   ALBUMIN 3.9 01/28/2024   AST 18 01/28/2024   ALT 9 01/28/2024   ALKPHOS 78 01/28/2024   BILITOT 0.2 01/28/2024    Lab Results  Component Value Date   WBC 5.7 03/14/2024   NEUTROABS 3.1 01/28/2024   HGB 12.2 (L) 03/14/2024   HCT 34.5 (L) 03/14/2024   MCV 76.2 (L) 03/14/2024   PLT 145 (L) 03/14/2024     STUDIES: No results found.  ASSESSMENT: Thrombocytopenia.  PLAN:    Thrombocytopenia: Mild.  Patient's platelet count is 145 today.  Previously, iron panel was within normal limits.  All of his other laboratory work from today is pending at time of dictation.  No intervention is needed.  Patient does not require bone marrow biopsy.  He will have a video-assisted telemedicine visit in 3 weeks to discuss the results.  I spent a total of 45 minutes reviewing chart data, face-to-face evaluation with the patient, counseling and coordination of care as detailed above.   Patient expressed understanding and was in agreement with this plan. He also understands that He can call clinic at any time with any questions, concerns, or complaints.    Shellie Dials, MD   03/14/2024 2:31 PM

## 2024-03-17 LAB — PROTEIN ELECTROPHORESIS, SERUM
A/G Ratio: 1.3 (ref 0.7–1.7)
Albumin ELP: 3.6 g/dL (ref 2.9–4.4)
Alpha-1-Globulin: 0.2 g/dL (ref 0.0–0.4)
Alpha-2-Globulin: 0.6 g/dL (ref 0.4–1.0)
Beta Globulin: 0.9 g/dL (ref 0.7–1.3)
Gamma Globulin: 1.1 g/dL (ref 0.4–1.8)
Globulin, Total: 2.8 g/dL (ref 2.2–3.9)
Total Protein ELP: 6.4 g/dL (ref 6.0–8.5)

## 2024-03-19 LAB — PLATELET ANTIBODY PROFILE
Glycoprotein IV Antibody: NEGATIVE
HLA Ab Ser Ql EIA: NEGATIVE
IA/IIA Antibody: NEGATIVE
IB/IX Antibody: NEGATIVE
IIB/IIIA Antibody: NEGATIVE

## 2024-04-08 ENCOUNTER — Inpatient Hospital Stay: Admitting: Oncology

## 2024-04-16 LAB — INTELLIGEN MYELOID

## 2024-04-18 ENCOUNTER — Inpatient Hospital Stay: Attending: Oncology | Admitting: Oncology

## 2024-04-18 ENCOUNTER — Encounter: Payer: Self-pay | Admitting: Oncology

## 2024-04-18 DIAGNOSIS — Z87891 Personal history of nicotine dependence: Secondary | ICD-10-CM | POA: Diagnosis not present

## 2024-04-18 DIAGNOSIS — D696 Thrombocytopenia, unspecified: Secondary | ICD-10-CM

## 2024-04-18 NOTE — Progress Notes (Signed)
 Patient is doing ok, and is ready for the scheduled MyChart visit today at 11:00 am.

## 2024-04-18 NOTE — Progress Notes (Signed)
 [facility]  Telephone:(336) [phone] Fax:(336) [phone]  ID: Chad Jenkins OB: 1966/06/23  MR#: [id number]  RDW#:[id number]  Patient Care Team: Ziglar, Susan K, MD as PCP - General (Family Medicine) Jacobo Evalene PARAS, MD as Consulting Physician (Oncology)  I connected with Chad Jenkins on 04/18/24 at 11:00 AM EDT by video enabled telemedicine visit and verified that I am speaking with the correct person using two identifiers.   I discussed the limitations, risks, security and privacy concerns of performing an evaluation and management service by telemedicine and the availability of in-person appointments. I also discussed with the patient that there may be a patient responsible charge related to this service. The patient expressed understanding and agreed to proceed.   Other persons participating in the visit and their role in the encounter: Patient, MD.  Patient's location: Home. Provider's location: Clinic.  CHIEF COMPLAINT: Thrombocytopenia.  INTERVAL HISTORY: Patient agreed to video-assisted telemedicine visit for further evaluation and discussion of his laboratory results.  He continues to feel well and remains asymptomatic. He has no neurologic complaints.  He denies any recent fevers or illnesses.  He has a good appetite and denies weight loss.  He has no chest pain, shortness of breath, cough, or hemoptysis.  He denies any nausea, vomiting, constipation, or diarrhea.  He has no urinary complaints.  Patient offers no specific complaints today.  REVIEW OF SYSTEMS:   Review of Systems  Constitutional: Negative.  Negative for fever, malaise/fatigue and weight loss.  Respiratory: Negative.  Negative for cough, hemoptysis and shortness of breath.   Cardiovascular: Negative.  Negative for chest pain and leg swelling.  Gastrointestinal: Negative.  Negative for abdominal pain.  Genitourinary: Negative.  Negative for dysuria.  Musculoskeletal: Negative.  Negative  for back pain.  Skin: Negative.  Negative for rash.  Neurological: Negative.  Negative for dizziness, focal weakness, weakness and headaches.  Psychiatric/Behavioral: Negative.  The patient is not nervous/anxious.     As per HPI. Otherwise, a complete review of systems is negative.  PAST MEDICAL HISTORY: Past Medical History:  Diagnosis Date   Bulging lumbar disc     PAST SURGICAL HISTORY: Past Surgical History:  Procedure Laterality Date   NO PAST SURGERIES      FAMILY HISTORY: Family History  Problem Relation Age of Onset   Healthy Mother    Cancer Father        brain   Healthy Son    Healthy Son    Cancer Maternal Uncle    Cancer Paternal Grandmother    Cancer Paternal Grandfather    Glaucoma Paternal Grandfather     ADVANCED DIRECTIVES (Y/N):  N  HEALTH MAINTENANCE: Social History   Tobacco Use   Smoking status: Former    Types: Cigars    Quit date: 1997    Years since quitting: 28.5    Passive exposure: Current   Smokeless tobacco: Never  Vaping Use   Vaping status: Never Used  Substance Use Topics   Alcohol use: Yes    Alcohol/week: 4.0 standard drinks of alcohol    Types: 4 Shots of liquor per week   Drug use: Yes    Types: Marijuana     Colonoscopy:  PAP:  Bone density:  Lipid panel:  No Known Allergies  Current Outpatient Medications  Medication Sig Dispense Refill   gabapentin  (NEURONTIN ) 600 MG tablet Take 1 tablet (600 mg total) by mouth at bedtime. 90 tablet 1   tiZANidine  (ZANAFLEX ) 4 MG tablet Take  1 tablet by mouth every 8 (eight) hours.     No current facility-administered medications for this visit.    OBJECTIVE: There were no vitals filed for this visit.    There is no height or weight on file to calculate BMI.    ECOG FS:0 - Asymptomatic  General: Well-developed, well-nourished, no acute distress. HEENT: Normocephalic. Neuro: Alert, answering all questions appropriately. Cranial nerves grossly intact. Psych: Normal  affect.  LAB RESULTS:  Lab Results  Component Value Date   NA 141 01/28/2024   K 4.1 01/28/2024   CL 107 (H) 01/28/2024   CO2 20 01/28/2024   GLUCOSE 123 (H) 01/28/2024   BUN 10 01/28/2024   CREATININE 1.11 01/28/2024   CALCIUM 8.7 01/28/2024   PROT 6.4 01/28/2024   ALBUMIN 3.9 01/28/2024   AST 18 01/28/2024   ALT 9 01/28/2024   ALKPHOS 78 01/28/2024   BILITOT 0.2 01/28/2024    Lab Results  Component Value Date   WBC 5.7 03/14/2024   NEUTROABS 3.1 01/28/2024   HGB 12.2 (L) 03/14/2024   HCT 34.5 (L) 03/14/2024   MCV 76.2 (L) 03/14/2024   PLT 145 (L) 03/14/2024     STUDIES: No results found.  ASSESSMENT: Thrombocytopenia.  PLAN:    Thrombocytopenia: Mild.  Patient's most recent platelet count was improved to 145.  Previously, all of his other laboratory work was either negative or within normal limits.  No intervention is needed at this time.  Patient does not require bone marrow biopsy.  No further intervention is needed.  No follow-up has been scheduled.  Please refer patient back if there are any questions or concerns.    I provided 20 minutes of face-to-face video visit time during this encounter which included chart review, counseling, and coordination of care as documented above.   Patient expressed understanding and was in agreement with this plan. He also understands that He can call clinic at any time with any questions, concerns, or complaints.    Evalene JINNY Reusing, MD   04/18/2024 11:12 AM

## 2024-04-28 DIAGNOSIS — H52223 Regular astigmatism, bilateral: Secondary | ICD-10-CM | POA: Diagnosis not present

## 2024-04-28 DIAGNOSIS — H401131 Primary open-angle glaucoma, bilateral, mild stage: Secondary | ICD-10-CM | POA: Diagnosis not present

## 2024-04-28 DIAGNOSIS — H04123 Dry eye syndrome of bilateral lacrimal glands: Secondary | ICD-10-CM | POA: Diagnosis not present

## 2024-04-28 DIAGNOSIS — H5203 Hypermetropia, bilateral: Secondary | ICD-10-CM | POA: Diagnosis not present

## 2024-04-28 DIAGNOSIS — H524 Presbyopia: Secondary | ICD-10-CM | POA: Diagnosis not present

## 2024-06-05 ENCOUNTER — Encounter: Payer: Self-pay | Admitting: Family Medicine

## 2024-06-05 ENCOUNTER — Ambulatory Visit (INDEPENDENT_AMBULATORY_CARE_PROVIDER_SITE_OTHER): Admitting: Family Medicine

## 2024-06-05 VITALS — BP 110/66 | HR 52 | Ht 73.5 in | Wt 154.0 lb

## 2024-06-05 DIAGNOSIS — D696 Thrombocytopenia, unspecified: Secondary | ICD-10-CM

## 2024-06-05 DIAGNOSIS — E538 Deficiency of other specified B group vitamins: Secondary | ICD-10-CM | POA: Diagnosis not present

## 2024-06-05 NOTE — Assessment & Plan Note (Signed)
 B12 is 210.  Ask him to get B12 500 mcg OTC and take daily.  Will reevaluate B12 level in 3 months.

## 2024-06-05 NOTE — Assessment & Plan Note (Addendum)
 Last platelet count was 145K.  Situation has resolved.  Was evaluated by hematology.

## 2024-06-05 NOTE — Progress Notes (Signed)
   Established Patient Office Visit  Subjective   Patient ID: Chad Jenkins, male    DOB: 1965-12-07  Age: 58 y.o. MRN: [id number]  Chief Complaint  Patient presents with   Medical Management of Chronic Issues    12-month follow up; denies any main concerns for today's visit.    HPI Discussed the use of AI scribe software for clinical note transcription with the patient, who gave verbal consent to proceed,   History of Present Illness   Chad Jenkins is a 58 year old male with thrombocytopenia (Hematology evaluated, resolved) B12 deficiency (210 03/14/24) who presents for medication refills and follow-up on low B12 levels.  He has been informed of a low B12 level.    He is currently taking potassium, vitamin D3, sumatriptan, and atorvastatin, and requires refills for these medications.  No other problems reported. He feels great.  He is looking forward to his upcoming birthday and a family reunion, expecting around 200 attendees from various locations.      ROS    Objective:     BP 110/66 (BP Location: Left Arm, Patient Position: Sitting, Cuff Size: Normal)   Pulse (!) 52   Ht 6' 1.5 (1.867 m)   Wt 154 lb (69.9 kg)   SpO2 97%   BMI 20.04 kg/m    Physical Exam Vitals and nursing note reviewed.  Constitutional:      Appearance: Normal appearance.  HENT:     Head: Normocephalic and atraumatic.  Eyes:     Conjunctiva/sclera: Conjunctivae normal.  Cardiovascular:     Rate and Rhythm: Normal rate and regular rhythm.  Pulmonary:     Effort: Pulmonary effort is normal.     Breath sounds: Normal breath sounds.  Musculoskeletal:     Right lower leg: No edema.     Left lower leg: No edema.  Skin:    General: Skin is warm and dry.  Neurological:     Mental Status: He is alert and oriented to person, place, and time.  Psychiatric:        Mood and Affect: Mood normal.        Behavior: Behavior normal.        Thought Content: Thought content normal.        Judgment:  Judgment normal.          No results found for any visits on 06/05/24.    The ASCVD Risk score (Arnett DK, et al., 2019) failed to calculate for the following reasons:   The valid total cholesterol range is 130 to 320 mg/dL    Assessment & Plan:  Thrombocytopenia (HCC) Assessment & Plan: Last platelet count was 145K.  Situation has resolved.  Was evaluated by hematology.   B12 deficiency Assessment & Plan: B12 is 210.  Ask him to get B12 500 mcg OTC and take daily.  Will reevaluate B12 level in 3 months.      Return in about 3 months (around 09/05/2024).    Jaydynn Wolford K Mahmood Boehringer, MD

## 2024-07-29 ENCOUNTER — Emergency Department (HOSPITAL_COMMUNITY)

## 2024-07-29 ENCOUNTER — Other Ambulatory Visit: Payer: Self-pay

## 2024-07-29 ENCOUNTER — Emergency Department (HOSPITAL_COMMUNITY): Admission: EM | Admit: 2024-07-29 | Discharge: 2024-07-29 | Disposition: A | Discharge status: Home / Self Care

## 2024-07-29 DIAGNOSIS — R0789 Other chest pain: Secondary | ICD-10-CM | POA: Diagnosis not present

## 2024-07-29 DIAGNOSIS — M62830 Muscle spasm of back: Secondary | ICD-10-CM | POA: Diagnosis not present

## 2024-07-29 DIAGNOSIS — M549 Dorsalgia, unspecified: Secondary | ICD-10-CM | POA: Diagnosis not present

## 2024-07-29 DIAGNOSIS — R079 Chest pain, unspecified: Secondary | ICD-10-CM | POA: Diagnosis present

## 2024-07-29 LAB — CBC
HCT: 36.2 % — ABNORMAL LOW (ref 39.0–52.0)
Hemoglobin: 12.5 g/dL — ABNORMAL LOW (ref 13.0–17.0)
MCH: 26.7 pg (ref 26.0–34.0)
MCHC: 34.5 g/dL (ref 30.0–36.0)
MCV: 77.4 fL — ABNORMAL LOW (ref 80.0–100.0)
Platelets: 136 K/uL — ABNORMAL LOW (ref 150–400)
RBC: 4.68 MIL/uL (ref 4.22–5.81)
RDW: 14.7 % (ref 11.5–15.5)
WBC: 8 K/uL (ref 4.0–10.5)
nRBC: 0 % (ref 0.0–0.2)

## 2024-07-29 LAB — BASIC METABOLIC PANEL WITH GFR
Anion gap: 9 (ref 5–15)
BUN: 12 mg/dL (ref 6–20)
CO2: 24 mmol/L (ref 22–32)
Calcium: 9.1 mg/dL (ref 8.9–10.3)
Chloride: 108 mmol/L (ref 98–111)
Creatinine, Ser: 0.93 mg/dL (ref 0.61–1.24)
GFR, Estimated: >60 mL/min (ref >60)
Glucose, Bld: 101 mg/dL — ABNORMAL HIGH (ref 70–99)
Potassium: 3.8 mmol/L (ref 3.5–5.1)
Sodium: 142 mmol/L (ref 135–145)

## 2024-07-29 LAB — TROPONIN T, HIGH SENSITIVITY: Troponin T High Sensitivity: <15 ng/L (ref 0–19)

## 2024-07-29 MED ORDER — KETOROLAC TROMETHAMINE 15 MG/ML IJ SOLN
15.0000 mg | Freq: Once | INTRAMUSCULAR | Status: AC
  Administered 2024-07-29: 15 mg via INTRAMUSCULAR
  Filled 2024-07-29: qty 1

## 2024-07-29 MED ORDER — NAPROXEN 500 MG PO TABS: 500.0000 mg | ORAL_TABLET | Freq: Two times a day (BID) | ORAL | 0 refills | Status: AC

## 2024-07-29 NOTE — Discharge Instructions (Addendum)
 Take your naproxen  as prescribed.  You can continue Tylenol  and your muscle relaxers.  Follow-up with your primary care doctor.  Return to the ER for new or worsening symptoms.

## 2024-07-29 NOTE — ED Notes (Signed)
 Patient transported to X-ray

## 2024-07-29 NOTE — ED Triage Notes (Signed)
 Pt reports that around 930 in the morning while sitting at his desk he had sudden sharp left sided chest pain radiates into mid back, left shoulder, and under left arm. CP has been intermittent through out. No SOB. No dizziness/No NV. No diaphoresis.

## 2024-07-29 NOTE — ED Provider Notes (Signed)
 [place] [facility] AT [facility] Provider Note   CSN: [id number] Arrival date & time: 07/29/24  2035     Patient presents with: Chest Pain   Chad Jenkins is a 58 y.o. male.   58 year old male presents for evaluation of chest pain and back pain.  States it has been off-and-on today in his upper chest just under his collarbone and in his upper back as well.  He states it hurts when he moves or takes a deep breath.  States he did recently get over a cold and has been coughing quite frequently.  Denies any other symptoms or concerns.   Chest Pain Associated symptoms: back pain   Associated symptoms: no abdominal pain, no cough, no fever, no palpitations, no shortness of breath and no vomiting        Prior to Admission medications   Medication Sig Start Date End Date Taking? Authorizing Provider  naproxen  (NAPROSYN ) 500 MG tablet Take 1 tablet (500 mg total) by mouth 2 (two) times daily. 07/29/24  Yes Lazette Estala L, DO  gabapentin  (NEURONTIN ) 600 MG tablet Take 1 tablet (600 mg total) by mouth at bedtime. 03/04/24   Ziglar, Susan K, MD  tiZANidine  (ZANAFLEX ) 4 MG tablet Take 1 tablet by mouth every 8 (eight) hours. 02/28/23   [provider]    Allergies: Patient has no known allergies.    Review of Systems  Constitutional:  Negative for chills and fever.  HENT:  Negative for ear pain and sore throat.   Eyes:  Negative for pain and visual disturbance.  Respiratory:  Negative for cough and shortness of breath.   Cardiovascular:  Positive for chest pain. Negative for palpitations.  Gastrointestinal:  Negative for abdominal pain and vomiting.  Genitourinary:  Negative for dysuria and hematuria.  Musculoskeletal:  Positive for back pain. Negative for arthralgias.  Skin:  Negative for color change and rash.  Neurological:  Negative for seizures and syncope.  All other systems reviewed and are negative.   Updated Vital Signs BP (!) 139/94  (BP Location: Left Arm)   Pulse (!) 59   Temp 97.8 F (36.6 C) (Oral)   Resp 14   Ht 6' 1 (1.854 m)   Wt 72.6 kg   SpO2 99%   BMI 21.11 kg/m   Physical Exam Vitals and nursing note reviewed.  Constitutional:      General: He is not in acute distress.    Appearance: He is well-developed.  HENT:     Head: Normocephalic and atraumatic.  Eyes:     Conjunctiva/sclera: Conjunctivae normal.  Cardiovascular:     Rate and Rhythm: Normal rate and regular rhythm.     Heart sounds: No murmur heard. Pulmonary:     Effort: Pulmonary effort is normal. No respiratory distress.     Breath sounds: Normal breath sounds.  Chest:     Chest wall: Tenderness present.  Abdominal:     Palpations: Abdomen is soft.     Tenderness: There is no abdominal tenderness.  Musculoskeletal:        General: No swelling.     Cervical back: Neck supple.     Comments: Upper thoracic spine region tender to palpation  Skin:    General: Skin is warm and dry.     Capillary Refill: Capillary refill takes less than 2 seconds.  Neurological:     Mental Status: He is alert.  Psychiatric:        Mood and Affect: Mood  normal.     (all labs ordered are listed, but only abnormal results are displayed) Labs Reviewed  BASIC METABOLIC PANEL WITH GFR - Abnormal; Notable for the following components:      Result Value   Glucose, Bld 101 (*)    All other components within normal limits  CBC - Abnormal; Notable for the following components:   Hemoglobin 12.5 (*)    HCT 36.2 (*)    MCV 77.4 (*)    Platelets 136 (*)    All other components within normal limits  TROPONIN T, HIGH SENSITIVITY    EKG: EKG Interpretation Date/Time:  Tuesday July 29 2024 20:42:23 EDT Ventricular Rate:  58 PR Interval:  132 QRS Duration:  100 QT Interval:  464 QTC Calculation: 456 R Axis:   83  Text Interpretation: Sinus rhythm Nonspecific T abnrm anterior leads Anterolateral ST elevation, probable early repolarization  Compared with prior EKG from 12/26/2005 Confirmed by Gennaro Bouchard ([id number]) on 07/29/2024 9:35:44 PM  Radiology: [facility] Chest 2 View Result Date: 07/29/2024 CLINICAL DATA:  Mid chest pain, upper back pain EXAM: CHEST - 2 VIEW COMPARISON:  12/26/2005 FINDINGS: Frontal and lateral views of the chest demonstrate an unremarkable cardiac silhouette. No acute airspace disease, effusion, or pneumothorax. No acute bony abnormalities. IMPRESSION: 1. No acute intrathoracic process. Electronically Signed   By: Ozell Daring M.D.   On: 07/29/2024 20:56     Procedures   Medications Ordered in the ED  ketorolac  (TORADOL ) 15 MG/ML injection 15 mg (has no administration in time range)                                    Medical Decision Making Cardiac monitor interpretation: Sinus bradycardia, no ectopy  Patient here for musculoskeletal chest pain and upper back pain.  Did recently get over a cold and has been coughing frequently thinks this exacerbated his symptoms.  Cardiac workup including troponin chest x-ray and EKG are unremarkable.  His vitals are stable.  Will give him Toradol  here and he is advised Tylenol  and his muscle relaxer as needed and I will give prescription for naproxen .  Advised to return for new or worsening symptoms and otherwise follow-up with primary care doctor in 1 to 2 weeks.  He feels comfortable being discharged home.  Problems Addressed: Chest wall pain: acute illness or injury Muscle spasm of back: acute illness or injury  Amount and/or Complexity of Data Reviewed External Data Reviewed: notes.    Details: Prior outpatient records reviewed and patient follows up with hematology as he has a history of thrombocytopenia Labs: ordered. Decision-making details documented in ED Course.    Details: Ordered and reviewed by me and unremarkable, troponin negative Radiology: ordered and independent interpretation performed. Decision-making details documented in ED Course.    Details:  Ordered and interpreted by me independently of radiology Chest x-ray: Shows no acute abnormality ECG/medicine tests: ordered and independent interpretation performed. Decision-making details documented in ED Course.    Details: Ordered and interpreted by me in the absence of cardiology and shows a sinus rhythm, early repolarization, no STEMI or acute change when compared to prior EKG  Risk OTC drugs. Prescription drug management.     Final diagnoses:  Muscle spasm of back  Chest wall pain    ED Discharge Orders          Ordered    naproxen  (NAPROSYN ) 500 MG tablet  2  times daily        07/29/24 2144               Gennaro Duwaine CROME, DO 07/29/24 2147

## 2024-08-24 ENCOUNTER — Ambulatory Visit
Admission: EM | Admit: 2024-08-24 | Discharge: 2024-08-24 | Disposition: A | Discharge status: Home / Self Care | Attending: Emergency Medicine | Admitting: Emergency Medicine

## 2024-08-24 ENCOUNTER — Encounter: Payer: Self-pay | Admitting: Emergency Medicine

## 2024-08-24 DIAGNOSIS — F17200 Nicotine dependence, unspecified, uncomplicated: Secondary | ICD-10-CM | POA: Diagnosis not present

## 2024-08-24 DIAGNOSIS — T07XXXA Unspecified multiple injuries, initial encounter: Secondary | ICD-10-CM | POA: Diagnosis not present

## 2024-08-24 MED ORDER — SILVER SULFADIAZINE 1 % EX CREA: 1.0000 | TOPICAL_CREAM | Freq: Every day | CUTANEOUS | 0 refills | Status: AC

## 2024-08-24 MED ORDER — CEPHALEXIN 500 MG PO CAPS: 500.0000 mg | ORAL_CAPSULE | Freq: Four times a day (QID) | ORAL | 0 refills | Status: AC

## 2024-08-24 NOTE — Discharge Instructions (Addendum)
 Daily dressing changes to affected areas and or as needed if soiled(right elbow,right wrist/hand, left forearm). Take keflex(abx) as directed). Follow up with PCP in 3 days for wound check, sooner if worse.
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
## 2024-09-02 ENCOUNTER — Emergency Department (HOSPITAL_COMMUNITY)
Admission: EM | Admit: 2024-09-02 | Discharge: 2024-09-03 | Disposition: A | Discharge status: Home / Self Care | Attending: Emergency Medicine | Admitting: Emergency Medicine

## 2024-09-02 ENCOUNTER — Other Ambulatory Visit: Payer: Self-pay

## 2024-09-02 ENCOUNTER — Encounter (HOSPITAL_COMMUNITY): Payer: Self-pay | Admitting: Emergency Medicine

## 2024-09-02 DIAGNOSIS — L089 Local infection of the skin and subcutaneous tissue, unspecified: Secondary | ICD-10-CM | POA: Diagnosis not present
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
## 2024-09-03 ENCOUNTER — Ambulatory Visit (INDEPENDENT_AMBULATORY_CARE_PROVIDER_SITE_OTHER): Admitting: Family Medicine

## 2024-09-03 ENCOUNTER — Encounter: Payer: Self-pay | Admitting: Family Medicine

## 2024-09-03 VITALS — BP 113/74 | HR 54 | Temp 98.1°F | Resp 18 | Ht 73.0 in | Wt 156.0 lb

## 2024-09-03 DIAGNOSIS — D509 Iron deficiency anemia, unspecified: Secondary | ICD-10-CM | POA: Diagnosis not present

## 2024-09-03 DIAGNOSIS — M5416 Radiculopathy, lumbar region: Secondary | ICD-10-CM | POA: Diagnosis not present

## 2024-09-03 DIAGNOSIS — E538 Deficiency of other specified B group vitamins: Secondary | ICD-10-CM

## 2024-09-03 DIAGNOSIS — F5101 Primary insomnia: Secondary | ICD-10-CM | POA: Diagnosis not present

## 2024-09-03 MED ORDER — MELOXICAM 15 MG PO TABS: 15.0000 mg | ORAL_TABLET | Freq: Every day | ORAL | 1 refills | Status: AC

## 2024-09-03 MED ORDER — DOXYCYCLINE HYCLATE 100 MG PO CAPS: 100.0000 mg | ORAL_CAPSULE | Freq: Two times a day (BID) | ORAL | 0 refills | Status: AC

## 2024-09-03 MED ORDER — QUETIAPINE FUMARATE 100 MG PO TABS: 300.0000 mg | ORAL_TABLET | Freq: Every day | ORAL | 5 refills | Status: AC

## 2024-09-03 MED ORDER — TIZANIDINE HCL 4 MG PO TABS
4.0000 mg | ORAL_TABLET | Freq: Three times a day (TID) | ORAL | 1 refills | Status: AC
Start: 2024-09-03 — End: ?

## 2024-09-03 MED ORDER — MUPIROCIN CALCIUM 2 % EX CREA
TOPICAL_CREAM | Freq: Once | CUTANEOUS | Status: AC
  Filled 2024-09-03: qty 15

## 2024-09-03 NOTE — Progress Notes (Signed)
 Established Patient Office Visit  Subjective   Patient ID: Chad Jenkins, male    DOB: [birth year]  Age: 58 y.o. MRN: [id number]  Chief Complaint  Patient presents with   Medical Management of Chronic Issues    HPI [name] 59 year old gentleman with thrombocytopenia (hematology evaluated, resolved), B12 deficiency (210 03/14/2024), migraine headaches, mixed hyperlipidemia, right lumbar radiculopathy, insomnia.  He takes B12 5000 mcg daily without difficulty.  03/14/2024 B12 level was 210.  MCV 76.2.  Ferritin 103.       He has not been screened for colon cancer. Will resubmit referral for colonoscopy.  Has lumbar radiculopathy right leg.  Takes gabapentin  600 mg 3 times daily with some improvement in pain.  Has not had x-rays of his lumbar spine.  Initiation of movement is most painful.     Objective:     BP 113/74 (BP Location: Left Arm, Patient Position: Sitting, Cuff Size: Normal)   Pulse (!) 54   Temp 98.1 F (36.7 C) (Oral)   Resp 18   Ht 6' 1 (1.854 m)   Wt 156 lb (70.8 kg)   SpO2 97%   BMI 20.58 kg/m    Physical Exam Vitals and nursing note reviewed.  Constitutional:      Appearance: Normal appearance.  HENT:     Head: Normocephalic and atraumatic.  Eyes:     Conjunctiva/sclera: Conjunctivae normal.  Cardiovascular:     Rate and Rhythm: Normal rate and regular rhythm.  Pulmonary:     Effort: Pulmonary effort is normal.     Breath sounds: Normal breath sounds.  Musculoskeletal:     Right lower leg: No edema.     Left lower leg: No edema.  Skin:    General: Skin is warm and dry.  Neurological:     Mental Status: He is alert and oriented to person, place, and time.     Comments: Positive supine leg lift right 45 degrees left no radiculopathy.  Psychiatric:        Mood and Affect: Mood normal.        Behavior: Behavior normal.        Thought Content: Thought content normal.        Judgment: Judgment normal.          Results for orders placed  or performed in visit on 09/03/24  Vitamin B12  Result Value Ref Range   Vitamin B-12 >2000 (H) 232 - 1245 pg/mL      The ASCVD Risk score (Arnett DK, et al., 2019) failed to calculate for the following reasons:   The valid total cholesterol range is 130 to 320 mg/dL    Assessment & Plan:  Lumbar radiculopathy, right Assessment & Plan: Initiation of movement is most painful which may indicate muscle spasm.  Trial tizanidine  4 mg 3 times daily.  Orders: -     tiZANidine  HCl; Take 1 tablet (4 mg total) by mouth every 8 (eight) hours.  Dispense: 30 tablet; Refill: 1 -     DG Lumbar Spine Complete; Future  B12 deficiency Assessment & Plan: Checking B12 and methylmalonic acid levels.  Takes B12 5000 mcg daily.   Iron deficiency anemia, unspecified iron deficiency anemia type Assessment & Plan: MCV 76.2 despite a B12 deficiency.  Will check ferritin and iron saturation.   Other orders -     QUEtiapine  Fumarate; Take 3 tablets (300 mg total) by mouth at bedtime.  Dispense: 300 tablet; Refill: 5 -     Meloxicam ;  Take 1 tablet (15 mg total) by mouth daily.  Dispense: 30 tablet; Refill: 1 -     Vitamin B12     Return in about 1 week (around 09/10/2024).    Janzen Sacks K Ramyah Pankowski, MD

## 2024-09-03 NOTE — ED Notes (Signed)
 Pt axox4. GCS 15. Pt verbalizes understanding of discharge instructions, follow up, new Rx and wound care. Pt ambulated out of er with steady gait to transportation home by self
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
## 2024-09-03 NOTE — Discharge Instructions (Signed)
 Take antibiotics by mouth twice daily.  Complete the full course. Apply your antibiotic ointment twice daily.  When you apply the ointment, wash your hands, use a clean cloth to gently clean the area.  Apply ointment and cover.  Use a clean washcloth each time.  Recheck with your primary care provider in 2 days.

## 2024-09-04 LAB — VITAMIN B12: Vitamin B-12: >2000 pg/mL — ABNORMAL HIGH (ref 232–1245)

## 2024-09-06 NOTE — Assessment & Plan Note (Signed)
 MCV 76.2 despite a B12 deficiency.  Will check ferritin and iron saturation.

## 2024-09-06 NOTE — Assessment & Plan Note (Signed)
 Initiation of movement is most painful which may indicate muscle spasm.  Trial tizanidine  4 mg 3 times daily.

## 2024-09-06 NOTE — Assessment & Plan Note (Signed)
 Checking B12 and methylmalonic acid levels.  Takes B12 5000 mcg daily.

## 2024-09-08 LAB — METHYLMALONIC ACID, SERUM: Methylmalonic Acid: 122 nmol/L (ref 0–378)

## 2024-09-10 ENCOUNTER — Ambulatory Visit: Admitting: Family Medicine

## 2024-09-10 ENCOUNTER — Encounter: Payer: Self-pay | Admitting: Family Medicine

## 2024-09-10 ENCOUNTER — Ambulatory Visit: Payer: Self-pay | Admitting: Family Medicine

## 2024-09-10 VITALS — BP 98/58 | HR 60 | Ht 73.0 in | Wt 156.0 lb

## 2024-09-10 DIAGNOSIS — M5431 Sciatica, right side: Secondary | ICD-10-CM | POA: Diagnosis not present

## 2024-09-12 ENCOUNTER — Telehealth: Payer: Self-pay

## 2024-09-12 ENCOUNTER — Telehealth: Payer: Self-pay | Admitting: Family Medicine

## 2024-09-12 ENCOUNTER — Other Ambulatory Visit: Payer: Self-pay | Admitting: Family Medicine

## 2024-09-12 DIAGNOSIS — M5416 Radiculopathy, lumbar region: Secondary | ICD-10-CM

## 2024-09-12 NOTE — Telephone Encounter (Signed)
 Discussed going to physical therapy and getting xrays of his back and he agrees.

## 2024-09-12 NOTE — Telephone Encounter (Signed)
 Did not answer and voicemail was full.  Could not leave a message.

## 2024-09-12 NOTE — Assessment & Plan Note (Signed)
 Tizanidine  has helped with some of the muscle spasms and meloxicam  has helped with pain.  He declined an invitation to do physical therapy.  Will check lumbar films.

## 2024-09-12 NOTE — Progress Notes (Signed)
   Established Patient Office Visit  Subjective   Patient ID: Chad Jenkins, male    DOB: 02-07-1966  Age: 58 y.o. MRN: [id number]  Chief Complaint  Patient presents with   Follow-up    Tizanidine  - lumbar radiculopathy - helping  Meloxicam  - prn Has been using neosporin on cut - did not receive ointment from pharmacy  Patient is scratching area where wound is    HPI Chad Jenkins 58 year old gentleman with thrombocytopenia, (hematology evaluated, resolved), B12 deficiency (210 on 03/14/2024), migraine headaches, mixed hyperlipidemia, right lumbar radiculopathy, insomnia  He is taking tizanidine  for his back pain and it has helped.  Just takes 1 in the morning.  He has been taking meloxicam  daily and finds that helpful as well.    He has microcytic anemic.  He has a history of B12 deficiency as well.  At last visit B12 greater than 2000 and methylmalonic acid 122.SABRA  Hemoglobin 12.5 with MCV 77.         Objective:     BP (!) 98/58 (BP Location: Right Arm, Patient Position: Sitting, Cuff Size: Normal)   Pulse 60   Ht 6' 1 (1.854 m)   Wt 156 lb (70.8 kg)   SpO2 97%   BMI 20.58 kg/m    Physical Exam Vitals and nursing note reviewed.  Constitutional:      Appearance: Normal appearance.  HENT:     Head: Normocephalic and atraumatic.  Eyes:     Conjunctiva/sclera: Conjunctivae normal.  Cardiovascular:     Rate and Rhythm: Normal rate and regular rhythm.  Pulmonary:     Effort: Pulmonary effort is normal.     Breath sounds: Normal breath sounds.  Musculoskeletal:     Right lower leg: No edema.     Left lower leg: No edema.  Skin:    General: Skin is warm and dry.  Neurological:     Mental Status: He is alert and oriented to person, place, and time.  Psychiatric:        Mood and Affect: Mood normal.        Behavior: Behavior normal.        Thought Content: Thought content normal.        Judgment: Judgment normal.          No results found for any visits on  09/10/24.    The ASCVD Risk score (Arnett DK, et al., 2019) failed to calculate for the following reasons:   The valid total cholesterol range is 130 to 320 mg/dL    Assessment & Plan:  Neuralgia of right sciatic nerve Assessment & Plan: Tizanidine  has helped with some of the muscle spasms and meloxicam  has helped with pain.  Agreed to checking lumbar films and starting physical therapy      Return in about 8 weeks (around 11/05/2024).    Chad Jenkins K Iridian Reader, MD

## 2024-09-12 NOTE — Telephone Encounter (Signed)
 Patient was contacted by Dr. Ziglar in a separate encounter on 09/12/24 at 4:41 PM.

## 2024-09-12 NOTE — Telephone Encounter (Signed)
 Copied from CRM [id number]. Topic: General - Other >> Sep 12, 2024  4:26 PM Shanda MATSU wrote: Reason for CRM: Patient returned call made to him, notes show provider called patient, called [facility] to see if they could speak with the provider but she was in with a patient, CAL stated they will let provider know patient returned call and req that provider call the patient back, adv patient of this info and to wait for a call back.

## 2024-09-17 ENCOUNTER — Other Ambulatory Visit: Payer: Self-pay | Admitting: Family Medicine

## 2024-09-17 DIAGNOSIS — D509 Iron deficiency anemia, unspecified: Secondary | ICD-10-CM
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
## 2024-09-24 ENCOUNTER — Ambulatory Visit: Attending: Family Medicine | Admitting: Physical Therapy

## 2024-09-24 ENCOUNTER — Other Ambulatory Visit: Payer: Self-pay

## 2024-09-24 ENCOUNTER — Encounter: Payer: Self-pay | Admitting: Physical Therapy

## 2024-09-24 DIAGNOSIS — M5416 Radiculopathy, lumbar region: Secondary | ICD-10-CM | POA: Insufficient documentation

## 2024-09-24 DIAGNOSIS — M6281 Muscle weakness (generalized): Secondary | ICD-10-CM | POA: Diagnosis not present

## 2024-09-24 DIAGNOSIS — R252 Cramp and spasm: Secondary | ICD-10-CM | POA: Diagnosis not present

## 2024-09-24 DIAGNOSIS — R293 Abnormal posture: Secondary | ICD-10-CM | POA: Insufficient documentation

## 2024-10-14 ENCOUNTER — Ambulatory Visit (HOSPITAL_COMMUNITY)
Admission: EM | Admit: 2024-10-14 | Discharge: 2024-10-14 | Disposition: A | Discharge status: Home / Self Care | Attending: Family Medicine | Admitting: Family Medicine

## 2024-10-14 ENCOUNTER — Ambulatory Visit: Attending: Family Medicine | Admitting: Physical Therapy

## 2024-10-14 ENCOUNTER — Encounter: Payer: Self-pay | Admitting: Physical Therapy

## 2024-10-14 ENCOUNTER — Ambulatory Visit (HOSPITAL_COMMUNITY)

## 2024-10-14 ENCOUNTER — Encounter (HOSPITAL_COMMUNITY): Payer: Self-pay | Admitting: *Deleted

## 2024-10-14 DIAGNOSIS — M79642 Pain in left hand: Secondary | ICD-10-CM

## 2024-10-14 DIAGNOSIS — R252 Cramp and spasm: Secondary | ICD-10-CM | POA: Insufficient documentation

## 2024-10-14 DIAGNOSIS — M6281 Muscle weakness (generalized): Secondary | ICD-10-CM | POA: Insufficient documentation

## 2024-10-14 DIAGNOSIS — M79645 Pain in left finger(s): Secondary | ICD-10-CM

## 2024-10-14 DIAGNOSIS — M5416 Radiculopathy, lumbar region: Secondary | ICD-10-CM | POA: Insufficient documentation

## 2024-10-14 DIAGNOSIS — R293 Abnormal posture: Secondary | ICD-10-CM | POA: Insufficient documentation

## 2024-10-14 MED ORDER — PREDNISONE 10 MG (21) PO TBPK: ORAL_TABLET | Freq: Every day | ORAL | 0 refills | Status: AC
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
## 2024-10-14 NOTE — Discharge Instructions (Signed)
 Start taking the prednisone  taper tomorrow with breakfast Wear the thumb spica throughout the day to help provide support and compression You can ice and take Tylenol  as needed for any breakthrough pain Radiology overread showed degenerative changes but no acute fractures  Follow-up with orthopedic for any continued concerns with hand or thumb pain

## 2024-10-22 ENCOUNTER — Ambulatory Visit: Admitting: Physical Therapy

## 2024-10-22 ENCOUNTER — Encounter: Payer: Self-pay | Admitting: Physical Therapy

## 2024-10-29 ENCOUNTER — Ambulatory Visit

## 2024-11-05 ENCOUNTER — Ambulatory Visit: Admitting: Physical Therapy

## 2024-11-12 ENCOUNTER — Ambulatory Visit

## 2024-11-19 ENCOUNTER — Ambulatory Visit: Admitting: Physical Therapy
